# Patient Record
Sex: Female | Born: 1935 | Race: White | Hispanic: No | Smoking: Former smoker
Health system: Southern US, Community
[De-identification: ages and names within clinical notes are randomized; demographics above are authoritative.]

## PROBLEM LIST (undated history)

## (undated) DIAGNOSIS — R51 Headache: Secondary | ICD-10-CM

## (undated) DIAGNOSIS — I495 Sick sinus syndrome: Secondary | ICD-10-CM

## (undated) DIAGNOSIS — Z9989 Dependence on other enabling machines and devices: Secondary | ICD-10-CM

## (undated) DIAGNOSIS — I739 Peripheral vascular disease, unspecified: Secondary | ICD-10-CM

## (undated) DIAGNOSIS — E785 Hyperlipidemia, unspecified: Secondary | ICD-10-CM

## (undated) DIAGNOSIS — I4891 Unspecified atrial fibrillation: Secondary | ICD-10-CM

## (undated) DIAGNOSIS — R413 Other amnesia: Secondary | ICD-10-CM

## (undated) DIAGNOSIS — Z950 Presence of cardiac pacemaker: Secondary | ICD-10-CM

## (undated) DIAGNOSIS — I10 Essential (primary) hypertension: Secondary | ICD-10-CM

## (undated) DIAGNOSIS — I2510 Atherosclerotic heart disease of native coronary artery without angina pectoris: Secondary | ICD-10-CM

## (undated) DIAGNOSIS — G4733 Obstructive sleep apnea (adult) (pediatric): Secondary | ICD-10-CM

## (undated) HISTORY — PX: LUMBAR LAMINECTOMY: SHX95

## (undated) HISTORY — DX: Atherosclerotic heart disease of native coronary artery without angina pectoris: I25.10

## (undated) HISTORY — PX: TONSILLECTOMY: SUR1361

## (undated) HISTORY — DX: Hyperlipidemia, unspecified: E78.5

## (undated) HISTORY — DX: Dependence on other enabling machines and devices: Z99.89

## (undated) HISTORY — DX: Sick sinus syndrome: I49.5

## (undated) HISTORY — PX: ORIF HIP FRACTURE: SHX2125

## (undated) HISTORY — DX: Headache: R51

## (undated) HISTORY — PX: TOTAL HIP ARTHROPLASTY: SHX124

## (undated) HISTORY — DX: Essential (primary) hypertension: I10

## (undated) HISTORY — PX: LUMBAR FUSION: SHX111

## (undated) HISTORY — DX: Obstructive sleep apnea (adult) (pediatric): G47.33

## (undated) HISTORY — PX: CORONARY ANGIOPLASTY WITH STENT PLACEMENT: SHX49

---

## 2001-05-06 ENCOUNTER — Encounter: Payer: Self-pay | Admitting: Internal Medicine

## 2002-10-11 ENCOUNTER — Other Ambulatory Visit: Admission: RE | Admit: 2002-10-11 | Discharge: 2002-10-11 | Payer: Self-pay | Admitting: Obstetrics and Gynecology

## 2002-10-22 ENCOUNTER — Encounter: Payer: Self-pay | Admitting: Surgery

## 2002-10-25 ENCOUNTER — Encounter: Payer: Self-pay | Admitting: Surgery

## 2002-10-25 ENCOUNTER — Ambulatory Visit (HOSPITAL_COMMUNITY): Admission: RE | Admit: 2002-10-25 | Discharge: 2002-10-26 | Payer: Self-pay | Admitting: Surgery

## 2002-10-26 ENCOUNTER — Encounter: Payer: Self-pay | Admitting: Surgery

## 2003-01-27 ENCOUNTER — Encounter: Payer: Self-pay | Admitting: Emergency Medicine

## 2003-01-27 ENCOUNTER — Inpatient Hospital Stay (HOSPITAL_COMMUNITY): Admission: EM | Admit: 2003-01-27 | Discharge: 2003-01-28 | Payer: Self-pay | Admitting: Emergency Medicine

## 2003-02-16 ENCOUNTER — Encounter: Payer: Self-pay | Admitting: Family Medicine

## 2003-02-16 ENCOUNTER — Encounter: Admission: RE | Admit: 2003-02-16 | Discharge: 2003-02-16 | Payer: Self-pay | Admitting: Family Medicine

## 2003-03-01 ENCOUNTER — Ambulatory Visit (HOSPITAL_COMMUNITY): Admission: RE | Admit: 2003-03-01 | Discharge: 2003-03-01 | Payer: Self-pay | Admitting: Orthopedic Surgery

## 2003-03-01 ENCOUNTER — Encounter: Payer: Self-pay | Admitting: Orthopedic Surgery

## 2003-12-20 ENCOUNTER — Ambulatory Visit: Admission: RE | Admit: 2003-12-20 | Discharge: 2003-12-20 | Payer: Self-pay | Admitting: Internal Medicine

## 2004-02-27 ENCOUNTER — Encounter: Admission: RE | Admit: 2004-02-27 | Discharge: 2004-02-27 | Payer: Self-pay | Admitting: Obstetrics and Gynecology

## 2004-04-15 ENCOUNTER — Encounter: Payer: Self-pay | Admitting: Internal Medicine

## 2005-01-15 ENCOUNTER — Encounter: Admission: RE | Admit: 2005-01-15 | Discharge: 2005-04-15 | Payer: Self-pay | Admitting: Orthopaedic Surgery

## 2005-01-30 ENCOUNTER — Encounter: Admission: RE | Admit: 2005-01-30 | Discharge: 2005-01-30 | Payer: Self-pay | Admitting: Orthopaedic Surgery

## 2005-02-06 ENCOUNTER — Ambulatory Visit (HOSPITAL_COMMUNITY): Admission: RE | Admit: 2005-02-06 | Discharge: 2005-02-06 | Payer: Self-pay | Admitting: Neurosurgery

## 2005-02-19 ENCOUNTER — Inpatient Hospital Stay (HOSPITAL_COMMUNITY): Admission: RE | Admit: 2005-02-19 | Discharge: 2005-02-20 | Payer: Self-pay | Admitting: Neurosurgery

## 2005-05-21 ENCOUNTER — Encounter: Admission: RE | Admit: 2005-05-21 | Discharge: 2005-05-21 | Payer: Self-pay | Admitting: Family Medicine

## 2005-05-30 ENCOUNTER — Encounter: Admission: RE | Admit: 2005-05-30 | Discharge: 2005-05-30 | Payer: Self-pay | Admitting: Orthopaedic Surgery

## 2005-06-10 ENCOUNTER — Encounter: Admission: RE | Admit: 2005-06-10 | Discharge: 2005-06-10 | Payer: Self-pay | Admitting: Orthopaedic Surgery

## 2005-06-11 ENCOUNTER — Encounter: Admission: RE | Admit: 2005-06-11 | Discharge: 2005-06-11 | Payer: Self-pay | Admitting: Orthopaedic Surgery

## 2005-07-09 ENCOUNTER — Ambulatory Visit (HOSPITAL_COMMUNITY): Admission: RE | Admit: 2005-07-09 | Discharge: 2005-07-09 | Payer: Self-pay | Admitting: Neurosurgery

## 2005-07-18 ENCOUNTER — Inpatient Hospital Stay (HOSPITAL_COMMUNITY): Admission: RE | Admit: 2005-07-18 | Discharge: 2005-07-21 | Payer: Self-pay | Admitting: Neurosurgery

## 2006-05-22 ENCOUNTER — Encounter: Admission: RE | Admit: 2006-05-22 | Discharge: 2006-05-22 | Payer: Self-pay | Admitting: Obstetrics and Gynecology

## 2006-06-11 ENCOUNTER — Ambulatory Visit (HOSPITAL_COMMUNITY): Admission: RE | Admit: 2006-06-11 | Discharge: 2006-06-11 | Payer: Self-pay | Admitting: Neurosurgery

## 2006-06-11 ENCOUNTER — Encounter: Payer: Self-pay | Admitting: Vascular Surgery

## 2007-06-09 ENCOUNTER — Encounter: Admission: RE | Admit: 2007-06-09 | Discharge: 2007-06-09 | Payer: Self-pay | Admitting: Obstetrics and Gynecology

## 2007-09-01 ENCOUNTER — Emergency Department (HOSPITAL_COMMUNITY): Admission: EM | Admit: 2007-09-01 | Discharge: 2007-09-01 | Payer: Self-pay | Admitting: Emergency Medicine

## 2007-09-09 ENCOUNTER — Encounter: Admission: RE | Admit: 2007-09-09 | Discharge: 2007-09-09 | Payer: Self-pay | Admitting: Orthopaedic Surgery

## 2007-09-11 ENCOUNTER — Encounter: Admission: RE | Admit: 2007-09-11 | Discharge: 2007-09-11 | Payer: Self-pay | Admitting: Orthopaedic Surgery

## 2007-10-20 ENCOUNTER — Encounter: Admission: RE | Admit: 2007-10-20 | Discharge: 2007-10-20 | Payer: Self-pay | Admitting: Interventional Radiology

## 2007-11-23 ENCOUNTER — Encounter: Admission: RE | Admit: 2007-11-23 | Discharge: 2007-11-23 | Payer: Self-pay | Admitting: Cardiology

## 2007-12-09 ENCOUNTER — Ambulatory Visit (HOSPITAL_COMMUNITY): Admission: RE | Admit: 2007-12-09 | Discharge: 2007-12-10 | Payer: Self-pay | Admitting: Cardiology

## 2007-12-09 HISTORY — PX: CORONARY ANGIOPLASTY WITH STENT PLACEMENT: SHX49

## 2008-03-08 ENCOUNTER — Ambulatory Visit (HOSPITAL_COMMUNITY): Admission: RE | Admit: 2008-03-08 | Discharge: 2008-03-08 | Payer: Self-pay | Admitting: Cardiovascular Disease

## 2008-03-20 ENCOUNTER — Inpatient Hospital Stay (HOSPITAL_COMMUNITY): Admission: EM | Admit: 2008-03-20 | Discharge: 2008-03-23 | Payer: Self-pay | Admitting: Emergency Medicine

## 2008-03-22 HISTORY — PX: CARDIAC CATHETERIZATION: SHX172

## 2008-04-28 ENCOUNTER — Encounter: Admission: RE | Admit: 2008-04-28 | Discharge: 2008-04-28 | Payer: Self-pay | Admitting: Neurosurgery

## 2008-06-06 ENCOUNTER — Ambulatory Visit (HOSPITAL_COMMUNITY): Admission: RE | Admit: 2008-06-06 | Discharge: 2008-06-06 | Payer: Self-pay | Admitting: *Deleted

## 2008-06-14 ENCOUNTER — Encounter: Admission: RE | Admit: 2008-06-14 | Discharge: 2008-06-14 | Payer: Self-pay | Admitting: *Deleted

## 2008-07-07 ENCOUNTER — Encounter: Admission: RE | Admit: 2008-07-07 | Discharge: 2008-07-07 | Payer: Self-pay | Admitting: Cardiovascular Disease

## 2008-07-12 ENCOUNTER — Inpatient Hospital Stay (HOSPITAL_COMMUNITY): Admission: RE | Admit: 2008-07-12 | Discharge: 2008-07-14 | Payer: Self-pay | Admitting: Cardiovascular Disease

## 2009-03-02 ENCOUNTER — Ambulatory Visit (HOSPITAL_COMMUNITY): Admission: RE | Admit: 2009-03-02 | Discharge: 2009-03-02 | Payer: Self-pay | Admitting: Neurosurgery

## 2009-03-22 ENCOUNTER — Inpatient Hospital Stay (HOSPITAL_COMMUNITY): Admission: AD | Admit: 2009-03-22 | Discharge: 2009-03-25 | Payer: Self-pay | Admitting: Cardiology

## 2009-10-13 IMAGING — CT CT ANGIO CHEST
2 of 6 series · 19 of 36 positions shown · IV contrast (APPLIED)
Comparison: None

CLINICAL DATA: Shortness of breath, syncope.

CT ANGIOGRAPHY CHEST WITH CONTRAST
TECHNIQUE: Multidetector CT imaging of the chest was performed
using the standard protocol during bolus administration of
intravenous contrast. Multiplanar CT image reconstructions
including MIPs were obtained to evaluate the vascular anatomy.
Contrast: 100 ml Fmnipaque-TNN

[Series 8: pulm embolism 1.0 b25f thins · axial · 0.62mm/px · z∈[-199,+26]mm · 18 of 251 slices shown]
[im 13/251  lung]
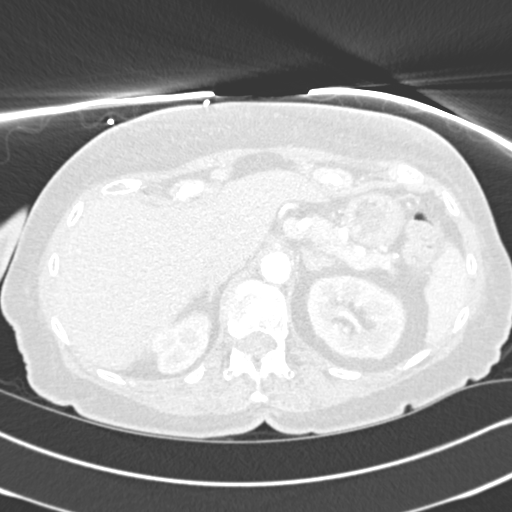
[im 26/251  mediastinal]
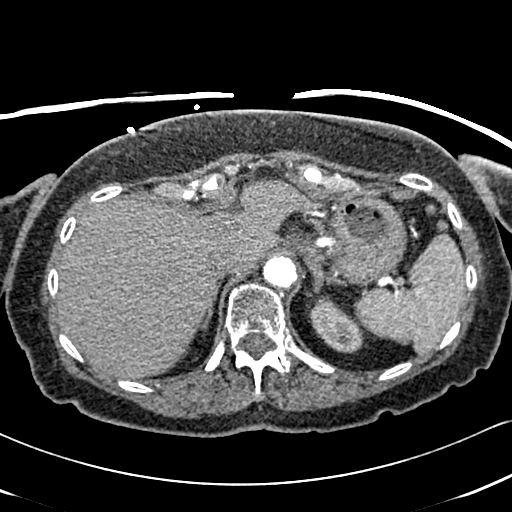
[im 38/251  lung]
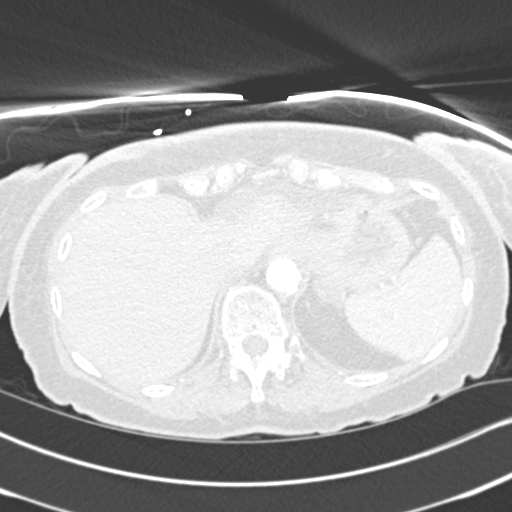
[im 51/251  mediastinal]
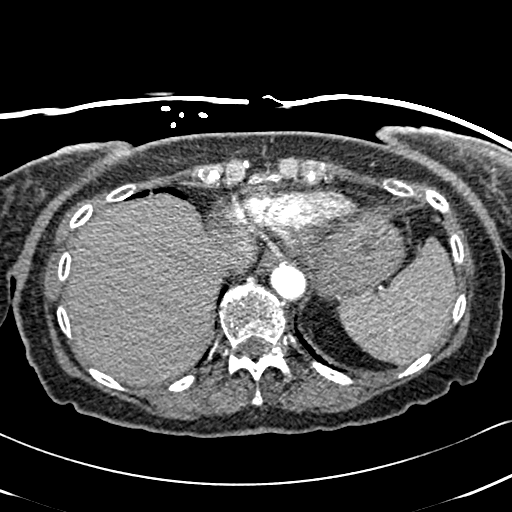
[im 63/251  lung]
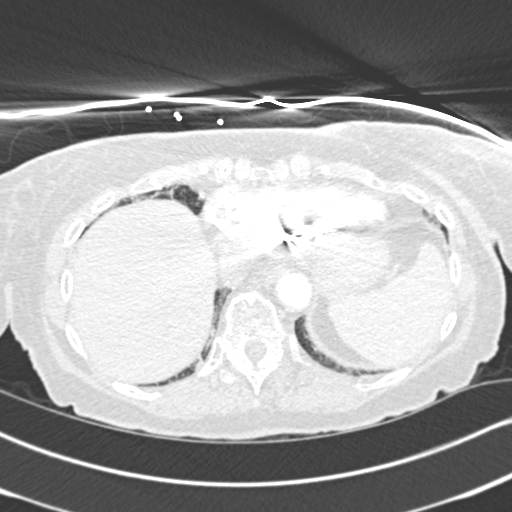
[im 76/251  mediastinal]
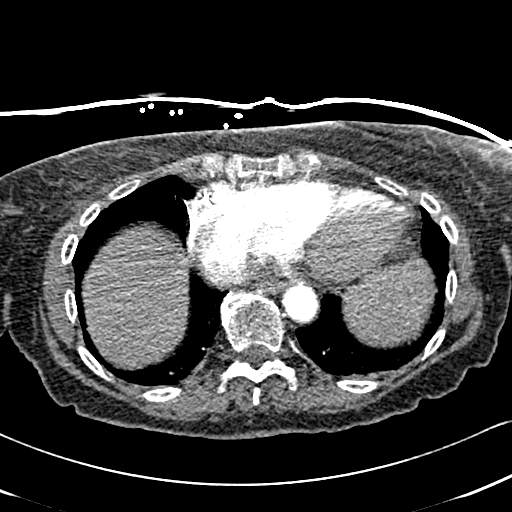
[im 88/251  lung]
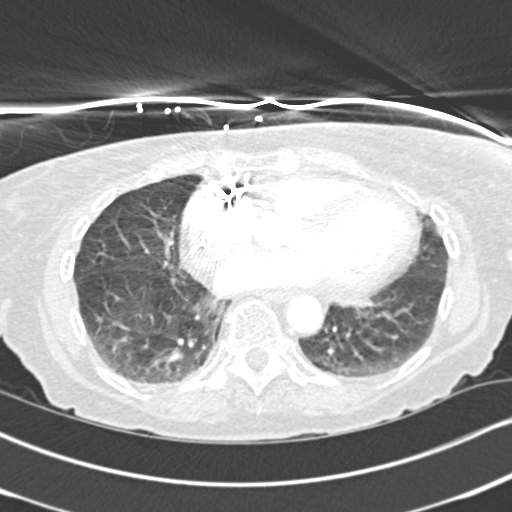
[im 101/251  mediastinal]
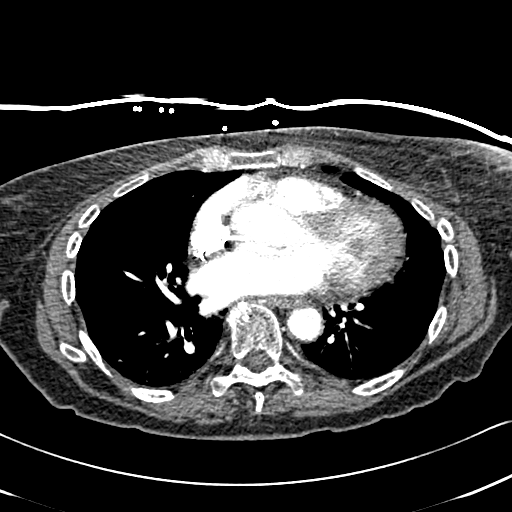
[im 113/251  lung]
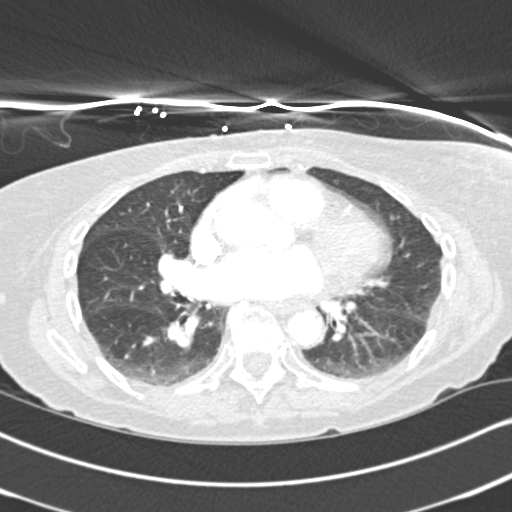
[im 138/251  mediastinal]
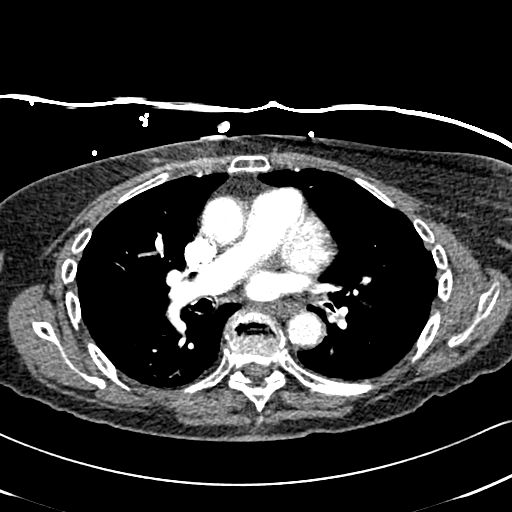
[im 151/251  lung]
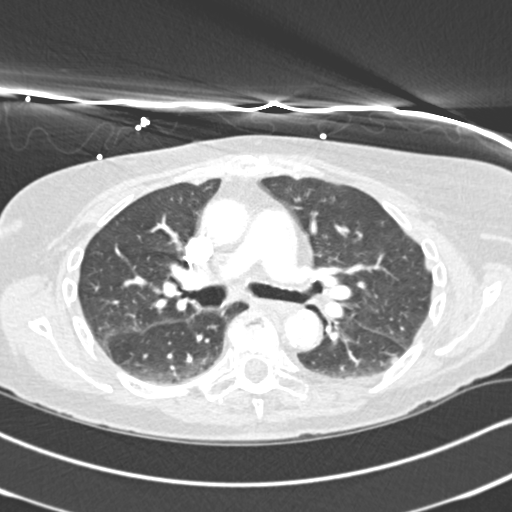
[im 163/251  mediastinal]
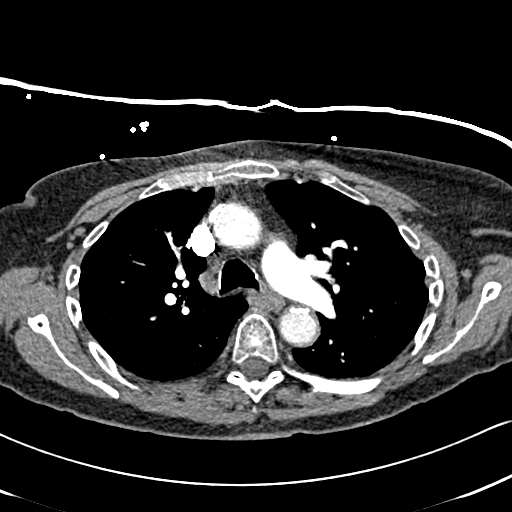
[im 176/251  lung]
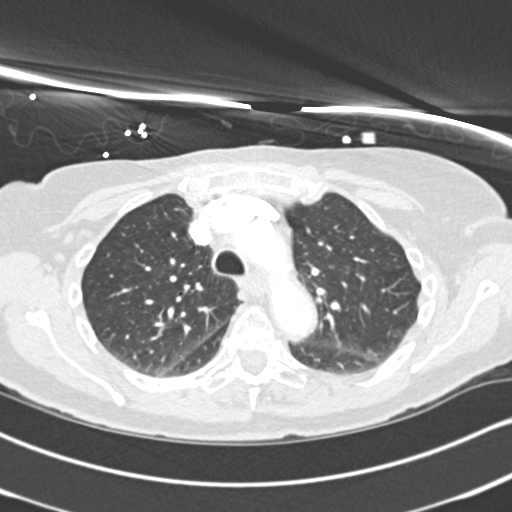
[im 188/251  mediastinal]
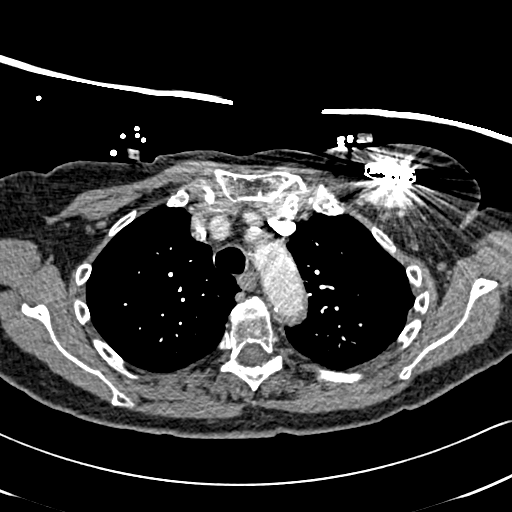
[im 201/251  lung]
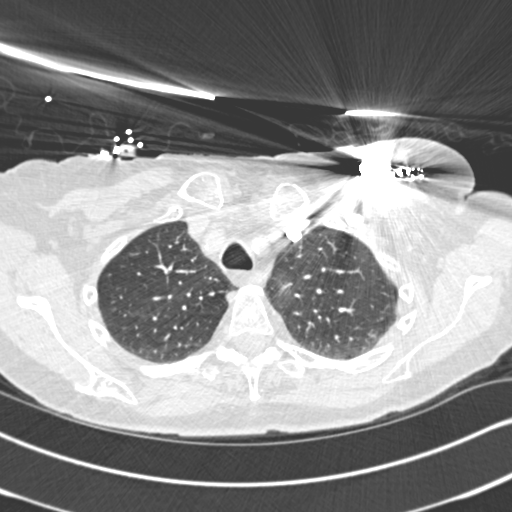
[im 213/251  mediastinal]
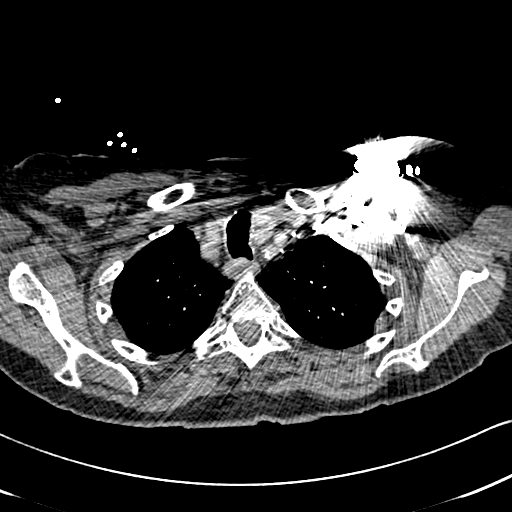
[im 226/251  lung]
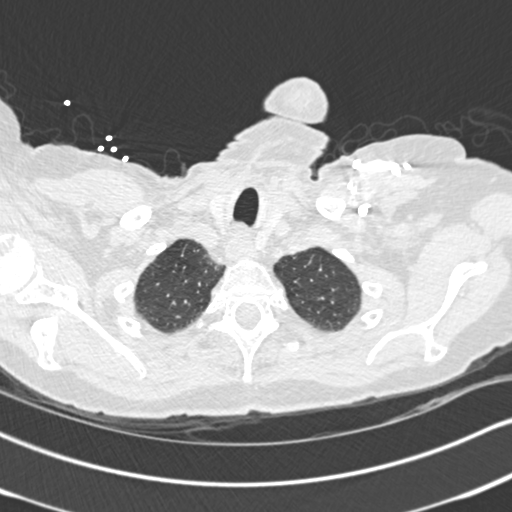
[im 238/251  mediastinal]
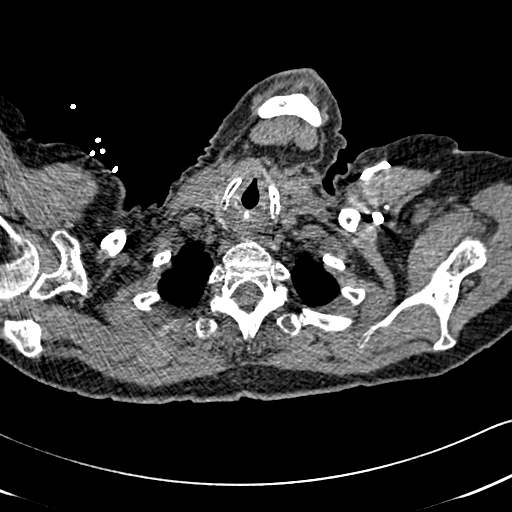

[Series 602: coronal · coronal · 0.62mm/px · 1 of 50 slices shown]
[im 25/50  mediastinal]
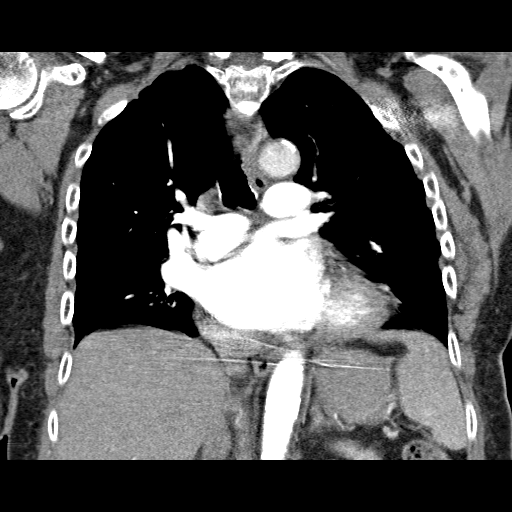

[19 of 36 positions shown; findings below may reference images not displayed]

FINDINGS: There is cardiomegaly.  Coronary artery calcifications
present.  Aorta is normal caliber.  Mild atherosclerotic disease in
the descending thoracic aorta.

No filling defects in the pulmonary arteries to suggest pulmonary
emboli. No mediastinal, hilar, or axillary adenopathy.  Visualized
thyroid and chest wall soft tissues unremarkable. Imaging into the
upper abdomen shows no acute findings.

Pacer is in place.  Review of lung windows demonstrates dependent
atelectasis.  Otherwise no focal opacities.  No effusions.

Review of the MIP images confirms the above findings.
IMPRESSION: No evidence of pulmonary embolus.

Cardiomegaly, coronary artery disease.

## 2009-11-24 ENCOUNTER — Inpatient Hospital Stay (HOSPITAL_COMMUNITY): Admission: EM | Admit: 2009-11-24 | Discharge: 2009-11-29 | Payer: Self-pay | Admitting: Emergency Medicine

## 2010-06-05 ENCOUNTER — Encounter: Admission: RE | Admit: 2010-06-05 | Discharge: 2010-06-05 | Payer: Self-pay | Admitting: Orthopedic Surgery

## 2010-07-26 ENCOUNTER — Inpatient Hospital Stay (HOSPITAL_COMMUNITY): Admission: RE | Admit: 2010-07-26 | Discharge: 2010-07-30 | Payer: Self-pay | Admitting: Orthopedic Surgery

## 2010-10-31 ENCOUNTER — Ambulatory Visit: Payer: Self-pay | Admitting: Internal Medicine

## 2010-10-31 ENCOUNTER — Encounter: Payer: Self-pay | Admitting: Internal Medicine

## 2010-10-31 DIAGNOSIS — R51 Headache: Secondary | ICD-10-CM

## 2010-10-31 DIAGNOSIS — I10 Essential (primary) hypertension: Secondary | ICD-10-CM | POA: Insufficient documentation

## 2010-10-31 DIAGNOSIS — E785 Hyperlipidemia, unspecified: Secondary | ICD-10-CM | POA: Insufficient documentation

## 2010-10-31 DIAGNOSIS — M1990 Unspecified osteoarthritis, unspecified site: Secondary | ICD-10-CM | POA: Insufficient documentation

## 2010-10-31 DIAGNOSIS — R519 Headache, unspecified: Secondary | ICD-10-CM | POA: Insufficient documentation

## 2010-10-31 DIAGNOSIS — M810 Age-related osteoporosis without current pathological fracture: Secondary | ICD-10-CM | POA: Insufficient documentation

## 2010-10-31 DIAGNOSIS — I2510 Atherosclerotic heart disease of native coronary artery without angina pectoris: Secondary | ICD-10-CM | POA: Insufficient documentation

## 2010-10-31 DIAGNOSIS — E559 Vitamin D deficiency, unspecified: Secondary | ICD-10-CM | POA: Insufficient documentation

## 2010-10-31 DIAGNOSIS — R279 Unspecified lack of coordination: Secondary | ICD-10-CM

## 2010-10-31 DIAGNOSIS — R413 Other amnesia: Secondary | ICD-10-CM | POA: Insufficient documentation

## 2010-10-31 LAB — CONVERTED CEMR LAB
ALT: 15 units/L (ref 0–35)
AST: 27 units/L (ref 0–37)
Albumin: 3.9 g/dL (ref 3.5–5.2)
Alkaline Phosphatase: 59 units/L (ref 39–117)
Basophils Absolute: 0 10*3/uL (ref 0.0–0.1)
Calcium: 9.3 mg/dL (ref 8.4–10.5)
Eosinophils Relative: 1.2 % (ref 0.0–5.0)
GFR calc non Af Amer: 81.6 mL/min (ref >60.00)
Glucose, Bld: 101 mg/dL — ABNORMAL HIGH (ref 70–99)
HCT: 37.2 % (ref 36.0–46.0)
Hemoglobin: 13.3 g/dL (ref 12.0–15.0)
Lymphocytes Relative: 15.7 % (ref 12.0–46.0)
Lymphs Abs: 1.2 10*3/uL (ref 0.7–4.0)
Monocytes Relative: 7.3 % (ref 3.0–12.0)
Neutro Abs: 6 10*3/uL (ref 1.4–7.7)
Potassium: 4.5 meq/L (ref 3.5–5.1)
RDW: 13.9 % (ref 11.5–14.6)
Sed Rate: 22 mm/hr (ref 0–22)
Sodium: 137 meq/L (ref 135–145)
TSH: 1.7 microintl units/mL (ref 0.35–5.50)
Total Bilirubin: 0.5 mg/dL (ref 0.3–1.2)
WBC: 7.9 10*3/uL (ref 4.5–10.5)

## 2010-11-01 ENCOUNTER — Telehealth: Payer: Self-pay | Admitting: Internal Medicine

## 2010-11-02 ENCOUNTER — Encounter: Payer: Self-pay | Admitting: Internal Medicine

## 2010-11-05 ENCOUNTER — Ambulatory Visit: Payer: Self-pay | Admitting: Cardiology

## 2010-11-15 ENCOUNTER — Encounter
Admission: RE | Admit: 2010-11-15 | Discharge: 2010-11-15 | Payer: Self-pay | Source: Home / Self Care | Attending: Internal Medicine | Admitting: Internal Medicine

## 2010-12-03 ENCOUNTER — Encounter
Admission: RE | Admit: 2010-12-03 | Discharge: 2010-12-25 | Payer: Self-pay | Source: Home / Self Care | Attending: Internal Medicine | Admitting: Internal Medicine

## 2010-12-16 ENCOUNTER — Encounter: Payer: Self-pay | Admitting: Interventional Radiology

## 2010-12-25 ENCOUNTER — Encounter: Payer: Self-pay | Admitting: Internal Medicine

## 2010-12-25 NOTE — Progress Notes (Signed)
Summary: CT  Phone Note Call from Patient Call back at Home Phone [phone]   Caller: Patient Call For: Etta Grandchild MD Reason for Call: Talk to Nurse Summary of Call: Requesting call back from nurse Initial call taken by: Orlan Leavens RMA,  November 01, 2010 11:14 AM  Follow-up for Phone Call        left mess to call office back...................Marland KitchenLamar Sprinkles, CMA  November 01, 2010 3:37 PM   Patient is requesting referral for CT prior to office visit w/spine specialist on 12/21. Follow-up by: Lamar Sprinkles, CMA,  November 02, 2010 11:25 AM  Additional Follow-up for Phone Call Additional follow up Details #1::        please check orders before sending a note, this CT has been ordered and scheduled Additional Follow-up by: Etta Grandchild MD,  November 02, 2010 11:29 AM    Additional Follow-up for Phone Call Additional follow up Details #2::    Original message was pt needed ct of her back. Spoke w/pt, she needs CT brain.  [facility] notes state pt is aware but I just spoke w/pt and she does not remember anyone talking her about CT. Transferred to [facility] for details. Follow-up by: Lamar Sprinkles, CMA,  November 02, 2010 11:52 AM

## 2010-12-25 NOTE — Assessment & Plan Note (Signed)
Summary: new / medicare / # cd   RS'D PER PT/NWS   Vital Signs:  Patient profile:   77 year old female Menstrual status:  postmenopausal Height:      62 inches Weight:      130.25 pounds BMI:     23.91 O2 Sat:      95 % on Room air Temp:     98.1 degrees F oral Pulse rate:   62 / minute Pulse rhythm:   regular Resp:     16 per minute BP sitting:   130 / 64  (left arm) Cuff size:   regular  Vitals Entered By: Rock Nephew CMA (October 31, 2010 1:30 PM)  O2 Flow:  Room air CC: New to establish, Hypertension Management, Preventive Care Is Patient Diabetic? No Pain Assessment Patient in pain? no          Menstrual Status postmenopausal   Primary Care [facility] Gul:  Etta Grandchild MD  CC:  New to establish, Hypertension Management, and Preventive Care.  History of Present Illness: New to me she comes in today and says that she needs a new PCP as her previous PCP, Dr. Su Hilt, is retiring soon. She has been declining over the last year  with memory loss and ataxia - she attributes this to having 2 hip surgeries and not recovering well from general anesthesia. She has trouble remembering recent events and daily activites. She knows that today is "[employer] Day".  Hypertension History:      She denies headache, chest pain, palpitations, dyspnea with exertion, orthopnea, PND, peripheral edema, visual symptoms, neurologic problems, syncope, and side effects from treatment.  She notes no problems with any antihypertensive medication side effects.        Positive major cardiovascular risk factors include female age 27 years old or older, hyperlipidemia, and hypertension.  Negative major cardiovascular risk factors include no history of diabetes and non-tobacco-user status.        Positive history for target organ damage include ASHD (either angina/prior MI/prior CABG).  Further assessment for target organ damage reveals no history of cardiac end-organ damage (CHF/LVH), stroke/TIA,  peripheral vascular disease, renal insufficiency, or hypertensive retinopathy.     Preventive Screening-Counseling & Management  Alcohol-Tobacco     Alcohol drinks/day: 0     Alcohol Counseling: not indicated; patient does not drink     Smoking Status: never     Tobacco Counseling: not indicated; no tobacco use  Caffeine-Diet-Exercise     Does Patient Exercise: no  Hep-HIV-STD-Contraception     Hepatitis Risk: no risk noted     HIV Risk: no risk noted     STD Risk: no risk noted     SBE monthly: yes     SBE Education/Counseling: to perform regular SBE      Sexual History:  not active.        Drug Use:  no.        Blood Transfusions:  no.    Current Medications (verified): 1)  Trazodone Hcl 50 Mg Tabs (Trazodone Hcl) 2)  Plavix 75 Mg Tabs (Clopidogrel Bisulfate) 3)  Vitamin D3 50000 Unit Caps (Cholecalciferol) 4)  Detrol La 2 Mg Xr24h-Cap (Tolterodine Tartrate) 5)  Losartan Potassium 100 Mg Tabs (Losartan Potassium) 6)  Celebrex 200 Mg Caps (Celecoxib) 7)  Amlodipine Besylate 5 Mg Tabs (Amlodipine Besylate) 8)  Aspirin 81mg   Allergies (verified): No Known Drug Allergies  Past History:  Past Medical History: Coronary artery disease Headache Hyperlipidemia Hypertension  Osteoarthritis Osteoporosis  Past Surgical History: Hip fracture ORIF Total hip replacement Lumbar laminectomy Lumbar fusion Permanent pacemaker PTCA/stent Tonsillectomy  Family History: Reviewed history and no changes required. Family History of Arthritis Family History Hypertension  Social History: Reviewed history and no changes required. Retired Conservation officer, [employer] Never Smoked Alcohol use-no Drug use-no Regular exercise-no Smoking Status:  never Hepatitis Risk:  no risk noted HIV Risk:  no risk noted STD Risk:  no risk noted Sexual History:  not active Blood Transfusions:  no Drug Use:  no Does Patient Exercise:  no  Review of Systems       The patient complains of weight  gain.  The patient denies anorexia, fever, weight loss, chest pain, syncope, dyspnea on exertion, peripheral edema, prolonged cough, headaches, hemoptysis, abdominal pain, incontinence, muscle weakness, suspicious skin lesions, depression, and breast masses.   Neuro:  Complains of difficulty with concentration, disturbances in coordination, memory loss, and poor balance; denies brief paralysis, falling down, headaches, inability to speak, numbness, seizures, sensation of room spinning, tingling, tremors, visual disturbances, and weakness.  Physical Exam  General:  alert, well-developed, well-nourished, well-hydrated, appropriate dress, normal appearance, healthy-appearing, and overweight-appearing.   Head:  normocephalic, atraumatic, no abnormalities observed, and no abnormalities palpated.   Eyes:  right pupil is very large and irregular. left pupil is 2 mm and does not constrict. Mouth:  Oral mucosa and oropharynx without lesions or exudates.  Teeth in good repair. Neck:  supple, full ROM, no masses, no thyromegaly, no thyroid nodules or tenderness, no JVD, normal carotid upstroke, no carotid bruits, no cervical lymphadenopathy, and no neck tenderness.   Lungs:  normal respiratory effort, no intercostal retractions, no accessory muscle use, normal breath sounds, no dullness, no fremitus, no crackles, and no wheezes.   Heart:  normal rate, regular rhythm, no murmur, no gallop, no rub, and no JVD.   Msk:  normal ROM, no joint tenderness, no joint swelling, no joint warmth, no redness over joints, no joint deformities, no joint instability, no crepitation, and no muscle atrophy.   Pulses:  R and L carotid,radial,femoral,dorsalis pedis and posterior tibial pulses are full and equal bilaterally Extremities:  No clubbing, cyanosis, edema, or deformity noted with normal full range of motion of all joints.   Neurologic:  alert & oriented X3, cranial nerves II-XII intact, strength normal in all extremities,  sensation intact to light touch, sensation intact to pinprick, DTRs symmetrical and normal, and toes up bilaterally on Babinski.  Her steps are short but her balance is good and Rhomberg test ir normal. Psych:  Oriented X3, normally interactive, good eye contact, not anxious appearing, not depressed appearing, easily distracted, poor concentration, and memory impairment.     Impression & Recommendations:  Problem # 1:  HYPERTENSION (ICD-401.9) Assessment Improved  Her updated medication list for this problem includes:    Losartan Potassium 100 Mg Tabs (Losartan potassium)    Amlodipine Besylate 5 Mg Tabs (Amlodipine besylate)  Orders: Venipuncture (47829) TLB-BMP (Basic Metabolic Panel-BMET) (80048-METABOL) TLB-CBC Platelet - w/Differential (85025-CBCD) TLB-Hepatic/Liver Function Pnl (80076-HEPATIC) TLB-TSH (Thyroid Stimulating Hormone) (84443-TSH) TLB-B12 + Folate Pnl (56213_08657-Q46/NGE) TLB-Sedimentation Rate (ESR) (85652-ESR) T-RPR (Syphilis) [phone]) T-Vitamin D (25-Hydroxy) [phone])  BP today: 130/64  10 Yr Risk Heart Disease: N/A  Problem # 2:  MEMORY LOSS (ICD-780.93) Assessment: New  Orders: Venipuncture (44034) TLB-BMP (Basic Metabolic Panel-BMET) (80048-METABOL) TLB-CBC Platelet - w/Differential (85025-CBCD) TLB-Hepatic/Liver Function Pnl (80076-HEPATIC) TLB-TSH (Thyroid Stimulating Hormone) (84443-TSH) TLB-B12 + Folate Pnl (74259_56387-F64/PPI) TLB-Sedimentation Rate (ESR) (85652-ESR) T-RPR (Syphilis) (95188-41660)  T-Vitamin D (25-Hydroxy) [phone]) Radiology Referral (Radiology)  Problem # 3:  ATAXIA (ICD-781.3) Assessment: New will check for CNS events and metabolic causes Orders: Venipuncture (09811) TLB-BMP (Basic Metabolic Panel-BMET) (80048-METABOL) TLB-CBC Platelet - w/Differential (85025-CBCD) TLB-Hepatic/Liver Function Pnl (80076-HEPATIC) TLB-TSH (Thyroid Stimulating Hormone) (84443-TSH) TLB-B12 + Folate Pnl  ([id number]-Z30/QMV) TLB-Sedimentation Rate (ESR) (85652-ESR) T-RPR (Syphilis) [phone]) T-Vitamin D (25-Hydroxy) [phone]) Radiology Referral (Radiology) Physical Therapy Referral (PT)  Problem # 4:  VITAMIN D DEFICIENCY (ICD-268.9) Assessment: Unchanged  Orders: Venipuncture (27253) TLB-BMP (Basic Metabolic Panel-BMET) (80048-METABOL) TLB-CBC Platelet - w/Differential (85025-CBCD) TLB-Hepatic/Liver Function Pnl (80076-HEPATIC) TLB-TSH (Thyroid Stimulating Hormone) (84443-TSH) TLB-B12 + Folate Pnl (66440_34742-V95/GLO) TLB-Sedimentation Rate (ESR) (85652-ESR) T-RPR (Syphilis) [phone]) T-Vitamin D (25-Hydroxy) (88416-60630)  Complete Medication List: 1)  Trazodone Hcl 50 Mg Tabs (Trazodone hcl) 2)  Plavix 75 Mg Tabs (Clopidogrel bisulfate) 3)  Vitamin D3 50000 Unit Caps (Cholecalciferol) 4)  Detrol La 2 Mg Xr24h-cap (Tolterodine tartrate) 5)  Losartan Potassium 100 Mg Tabs (Losartan potassium) 6)  Celebrex 200 Mg Caps (Celecoxib) 7)  Amlodipine Besylate 5 Mg Tabs (Amlodipine besylate) 8)  Aspirin 81mg    Hypertension Assessment/Plan:      The patient's hypertensive risk group is category C: Target organ damage and/or diabetes.  Today's blood pressure is 130/64.  Her blood pressure goal is < 140/90.  Colorectal Screening:  Current Recommendations:    Colonoscopy recommended: patient refused  PAP Screening:    Hx Cervical Dysplasia in last 5 yrs? No    3 normal PAP smears in last 5 yrs? Yes    Reviewed PAP smear recommendations:  patient refuses understanding risks of delayed diagnosis  Mammogram Screening:    Reviewed Mammogram recommendations:  mammogram ordered  Osteoporosis Risk Assessment:  Risk Factors for Fracture or Low Bone Density:   Race (White or Asian):     yes   Smoking status:       never  Immunization & Chemoprophylaxis:    Influenza vaccine: given  (08/25/2010)    Pneumovax: given  (08/25/2010)  Patient Instructions: 1)  Please  schedule a follow-up appointment in 1 month. 2)  It is important that you exercise regularly at least 20 minutes 5 times a week. If you develop chest pain, have severe difficulty breathing, or feel very tired , stop exercising immediately and seek medical attention. 3)  You need to lose weight. Consider a lower calorie diet and regular exercise.  4)  Schedule your mammogram.   Orders Added: 1)  Venipuncture [36415] 2)  TLB-BMP (Basic Metabolic Panel-BMET) [80048-METABOL] 3)  TLB-CBC Platelet - w/Differential [85025-CBCD] 4)  TLB-Hepatic/Liver Function Pnl [80076-HEPATIC] 5)  TLB-TSH (Thyroid Stimulating Hormone) [84443-TSH] 6)  TLB-B12 + Folate Pnl [82746_82607-B12/FOL] 7)  TLB-Sedimentation Rate (ESR) [85652-ESR] 8)  T-RPR (Syphilis) [16010-93235] 9)  T-Vitamin D (25-Hydroxy) [57322-02542] 10)  Radiology Referral [Radiology] 11)  Physical Therapy Referral [PT] 12)  Radiology Referral [Radiology] 13)  New Patient Level IV [70623]    Preventive Care Screening  Last Pneumovax:    Date:  08/25/2010    Results:  given   Last Flu Shot:    Date:  08/25/2010    Results:  given

## 2010-12-26 ENCOUNTER — Ambulatory Visit: Payer: Medicare Other | Attending: Internal Medicine | Admitting: Physical Therapy

## 2010-12-26 DIAGNOSIS — IMO0001 Reserved for inherently not codable concepts without codable children: Secondary | ICD-10-CM | POA: Insufficient documentation

## 2010-12-26 DIAGNOSIS — R269 Unspecified abnormalities of gait and mobility: Secondary | ICD-10-CM | POA: Insufficient documentation

## 2010-12-27 NOTE — Miscellaneous (Signed)
Summary: Health Care POA  Health Care POA   Imported By: Lester Phillips 11/10/2010 09:26:01  _____________________________________________________________________  External Attachment:    Type:   Image     Comment:   External Document

## 2010-12-27 NOTE — Letter (Signed)
Summary: Letter from son with directives  Letter from son with directives   Imported By: Lester Redford 11/10/2010 09:30:13  _____________________________________________________________________  External Attachment:    Type:   Image     Comment:   External Document

## 2010-12-27 NOTE — Miscellaneous (Signed)
Summary: Health Care Proxy  Health Care Proxy   Imported By: Lester Wilkes-Barre 11/10/2010 09:27:20  _____________________________________________________________________  External Attachment:    Type:   Image     Comment:   External Document

## 2010-12-27 NOTE — Miscellaneous (Signed)
Summary: Desire for a Natural Death  Desire for a Natural Death   Imported By: Lester Belzoni 11/10/2010 09:28:49  _____________________________________________________________________  External Attachment:    Type:   Image     Comment:   External Document

## 2010-12-31 ENCOUNTER — Ambulatory Visit: Payer: Medicare Other | Admitting: Physical Therapy

## 2011-01-02 ENCOUNTER — Ambulatory Visit: Payer: Medicare Other | Admitting: Physical Therapy

## 2011-01-02 ENCOUNTER — Encounter: Payer: Self-pay | Admitting: Internal Medicine

## 2011-01-02 ENCOUNTER — Ambulatory Visit (INDEPENDENT_AMBULATORY_CARE_PROVIDER_SITE_OTHER): Payer: Medicare Other | Admitting: Internal Medicine

## 2011-01-02 DIAGNOSIS — I10 Essential (primary) hypertension: Secondary | ICD-10-CM

## 2011-01-02 DIAGNOSIS — R413 Other amnesia: Secondary | ICD-10-CM

## 2011-01-02 DIAGNOSIS — R279 Unspecified lack of coordination: Secondary | ICD-10-CM

## 2011-01-02 DIAGNOSIS — N318 Other neuromuscular dysfunction of bladder: Secondary | ICD-10-CM

## 2011-01-10 NOTE — Assessment & Plan Note (Signed)
Summary: 2 MO ROV/NWS   Vital Signs:  Patient profile:   77 year old female Menstrual status:  postmenopausal Height:      62 inches Weight:      130.25 pounds BMI:     23.91 O2 Sat:      98 % on Room air Temp:     97.6 degrees F oral Pulse rate:   75 / minute Pulse rhythm:   regular Resp:     16 per minute BP sitting:   124 / 62  (left arm) Cuff size:   regular  Vitals Entered By: Rock Nephew CMA (January 02, 2011 2:07 PM)  O2 Flow:  Room air CC: follow-up visit, Hypertension Management Is Patient Diabetic? No Pain Assessment Patient in pain? no       Does patient need assistance? Functional Status Self care Ambulation Normal   Primary Care Provider:  Etta Grandchild MD  CC:  follow-up visit and Hypertension Management.  History of Present Illness: She returns for f/up and tells me that she is doing well. She was at a meeting last week and says that she did some dancing. She tells me that her son took her to [facility] for some testing last week and that after 3 hours of evaluation they told her that she had some minor brain damage from her surgeries last year but that she is geting better.  She wants to try a higher dose of detrol b/c she is still urinating frequently.  She wants to try a higher dose of trazodone b/c she is still having trouble sleeping with DFA and FA's.  Hypertension History:      She denies headache, chest pain, palpitations, dyspnea with exertion, orthopnea, PND, peripheral edema, visual symptoms, neurologic problems, syncope, and side effects from treatment.  She notes no problems with any antihypertensive medication side effects.        Positive major cardiovascular risk factors include female age 66 years old or older, hyperlipidemia, and hypertension.  Negative major cardiovascular risk factors include no history of diabetes and non-tobacco-user status.        Positive history for target organ damage include ASHD (either angina/prior  MI/prior CABG).  Further assessment for target organ damage reveals no history of cardiac end-organ damage (CHF/LVH), stroke/TIA, peripheral vascular disease, renal insufficiency, or hypertensive retinopathy.     Preventive Screening-Counseling & Management  Alcohol-Tobacco     Alcohol drinks/day: 0     Alcohol Counseling: not indicated; patient does not drink     Smoking Status: never     Tobacco Counseling: not indicated; no tobacco use  Hep-HIV-STD-Contraception     Hepatitis Risk: no risk noted     HIV Risk: no risk noted     STD Risk: no risk noted     SBE monthly: yes     SBE Education/Counseling: to perform regular SBE      Sexual History:  not active.        Drug Use:  no.        Blood Transfusions:  no.    Clinical Review Panels:  Prevention   Last Mammogram:  ASSESSMENT: Negative - BI-RADS 1^MM DIGITAL SCREENING (11/15/2010)  Immunizations   Last Flu Vaccine:  given (08/25/2010)   Last Pneumovax:  given (08/25/2010)  Diabetes Management   Creatinine:  0.7 (10/31/2010)   Last Flu Vaccine:  given (08/25/2010)   Last Pneumovax:  given (08/25/2010)  CBC   WBC:  7.9 (10/31/2010)   RBC:  4.01 (10/31/2010)   Hgb:  13.3 (10/31/2010)   Hct:  37.2 (10/31/2010)   Platelets:  172.0 (10/31/2010)   MCV  92.8 (10/31/2010)   MCHC  35.7 (10/31/2010)   RDW  13.9 (10/31/2010)   PMN:  75.5 (10/31/2010)   Lymphs:  15.7 (10/31/2010)   Monos:  7.3 (10/31/2010)   Eosinophils:  1.2 (10/31/2010)   Basophil:  0.3 (10/31/2010)  Complete Metabolic Panel   Glucose:  101 (10/31/2010)   Sodium:  137 (10/31/2010)   Potassium:  4.5 (10/31/2010)   Chloride:  99 (10/31/2010)   CO2:  31 (10/31/2010)   BUN:  22 (10/31/2010)   Creatinine:  0.7 (10/31/2010)   Albumin:  3.9 (10/31/2010)   Total Protein:  6.1 (10/31/2010)   Calcium:  9.3 (10/31/2010)   Total Bili:  0.5 (10/31/2010)   Alk Phos:  59 (10/31/2010)   SGPT (ALT):  15 (10/31/2010)   SGOT (AST):  27  (10/31/2010)   Medications Prior to Update: 1)  Trazodone Hcl 50 Mg Tabs (Trazodone Hcl) 2)  Plavix 75 Mg Tabs (Clopidogrel Bisulfate) 3)  Vitamin D3 50000 Unit Caps (Cholecalciferol) 4)  Detrol La 2 Mg Xr24h-Cap (Tolterodine Tartrate) 5)  Losartan Potassium 100 Mg Tabs (Losartan Potassium) 6)  Celebrex 200 Mg Caps (Celecoxib) 7)  Amlodipine Besylate 5 Mg Tabs (Amlodipine Besylate) 8)  Aspirin 81mg   Current Medications (verified): 1)  Plavix 75 Mg Tabs (Clopidogrel Bisulfate) 2)  Vitamin D3 50000 Unit Caps (Cholecalciferol) 3)  Losartan Potassium 100 Mg Tabs (Losartan Potassium) 4)  Celebrex 200 Mg Caps (Celecoxib) 5)  Amlodipine Besylate 5 Mg Tabs (Amlodipine Besylate) 6)  Aspirin 81mg  7)  Trazodone Hcl 100 Mg Tabs (Trazodone Hcl) .... One By Mouth At Bedtime 8)  Detrol La 4 Mg Xr24h-Cap (Tolterodine Tartrate) .... One By Mouth Once Daily For Overactive Bladder  Allergies (verified): No Known Drug Allergies  Past History:  Past Medical History: Last updated: 10/31/2010 Coronary artery disease Headache Hyperlipidemia Hypertension Osteoarthritis Osteoporosis  Past Surgical History: Last updated: 10/31/2010 Hip fracture ORIF Total hip replacement Lumbar laminectomy Lumbar fusion Permanent pacemaker PTCA/stent Tonsillectomy  Family History: Last updated: 10/31/2010 Family History of Arthritis Family History Hypertension  Social History: Last updated: 10/31/2010 Retired Widow/Widower Never Smoked Alcohol use-no Drug use-no Regular exercise-no  Risk Factors: Alcohol Use: 0 (01/02/2011) Exercise: no (10/31/2010)  Risk Factors: Smoking Status: never (01/02/2011)  Family History: Reviewed history from 10/31/2010 and no changes required. Family History of Arthritis Family History Hypertension  Social History: Reviewed history from 10/31/2010 and no changes required. 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   GU:  Complains of nocturia and urinary frequency; denies discharge, dysuria, hematuria, incontinence, and urinary hesitancy. Neuro:  Complains of memory loss; denies difficulty with concentration, disturbances in coordination, falling down, headaches, inability to speak, numbness, poor balance, seizures, tingling, tremors, visual disturbances, and weakness.  Physical Exam  General:  alert, well-developed, well-nourished, well-hydrated, appropriate dress, normal appearance, healthy-appearing, and overweight-appearing.   Head:  normocephalic and atraumatic.   Mouth:  Oral mucosa and oropharynx without lesions or exudates.  Teeth in good repair. Neck:  supple, full ROM, no masses, no thyromegaly, no thyroid nodules or tenderness, no JVD, normal carotid upstroke, no carotid bruits, no cervical lymphadenopathy,  and no neck tenderness.   Lungs:  normal respiratory effort, no intercostal retractions, no accessory muscle use, normal breath sounds, no dullness, no fremitus, no crackles, and no wheezes.   Heart:  normal rate, regular rhythm, no murmur, no gallop, no rub, and no JVD.   Abdomen:  soft, non-tender, normal bowel sounds, no distention, no masses, no guarding, no rigidity, no rebound tenderness, no abdominal hernia, no inguinal hernia, no hepatomegaly, and no splenomegaly.   Msk:  normal ROM, no joint tenderness, no joint swelling, no joint warmth, no redness over joints, no joint deformities, no joint instability, no crepitation, and no muscle atrophy.   Pulses:  R and L carotid,radial,femoral,dorsalis pedis and posterior tibial pulses are full and equal  bilaterally Extremities:  No clubbing, cyanosis, edema, or deformity noted with normal full range of motion of all joints.   Neurologic:  alert & oriented X3, cranial nerves II-XII intact, strength normal in all extremities, sensation intact to light touch, sensation intact to pinprick, gait normal, and DTRs symmetrical and normal.   Skin:  turgor normal, color normal, no rashes, no suspicious lesions, no ecchymoses, no petechiae, no purpura, no ulcerations, and no edema.   Psych:  Oriented X3, normally interactive, good eye contact, not anxious appearing, not depressed appearing, easily distracted, poor concentration, and memory impairment.     Impression & Recommendations:  Problem # 1:  HYPERTENSION (ICD-401.9) Assessment Improved  Her updated medication list for this problem includes:    Losartan Potassium 100 Mg Tabs (Losartan potassium)    Amlodipine Besylate 5 Mg Tabs (Amlodipine besylate)  BP today: 124/62 Prior BP: 130/64 (10/31/2010)  Prior 10 Yr Risk Heart Disease: N/A (10/31/2010)  Labs Reviewed: K+: 4.5 (10/31/2010) Creat: : 0.7 (10/31/2010)     Problem # 2:  MEMORY LOSS (ICD-780.93) Assessment: Improved  Problem # 3:  HYPERTONICITY OF BLADDER (ICD-596.51) Assessment: Deteriorated  Problem # 4:  ATAXIA (ICD-781.3) Assessment: Improved  Complete Medication List: 1)  Plavix 75 Mg Tabs (Clopidogrel bisulfate) 2)  Vitamin D3 50000 Unit Caps (Cholecalciferol) 3)  Losartan Potassium 100 Mg Tabs (Losartan potassium) 4)  Celebrex 200 Mg Caps (Celecoxib) 5)  Amlodipine Besylate 5 Mg Tabs (Amlodipine besylate) 6)  Aspirin 81mg   7)  Trazodone Hcl 100 Mg Tabs (Trazodone hcl) .... One by mouth at bedtime 8)  Detrol La 4 Mg Xr24h-cap (Tolterodine tartrate) .... One by mouth once daily for overactive bladder  Hypertension Assessment/Plan:      The patient's hypertensive risk group is category C: Target organ damage and/or diabetes.  Today's blood pressure is 124/62.  Her  blood pressure goal is < 140/90.  Patient Instructions: 1)  Please schedule a follow-up appointment in 3 months. Prescriptions: DETROL LA 4 MG XR24H-CAP (TOLTERODINE TARTRATE) One by mouth once daily for overactive bladder  #30 x 11   Entered and Authorized by:   Etta Grandchild MD   Signed by:   Etta Grandchild MD on 01/02/2011   Method used:   Electronically to        [facility] [phone][facility] (retail)       [address]       [place], [place]  [address]       Ph: [phone] or [phone]       Fax: [phone]   RxID:   [id number] TRAZODONE HCL 100 MG TABS (TRAZODONE HCL) One by mouth at bedtime  #30 x 11   Entered and Authorized by:   Etta Grandchild MD  Signed by:   Etta Grandchild MD on 01/02/2011   Method used:   Electronically to        [facility] [phone]* (retail)       [address]       [place], [place]  [address]       Ph: [phone] or [phone]       Fax: [phone]   RxID:   [id number]    Orders Added: 1)  Est. Patient Level IV [34742]

## 2011-01-16 NOTE — Letter (Signed)
Summary: [facility]  [facility]   Imported By: Sherian Rein 01/10/2011 10:11:45  _____________________________________________________________________  External Attachment:    Type:   Image     Comment:   External Document

## 2011-01-21 ENCOUNTER — Other Ambulatory Visit: Payer: Self-pay | Admitting: Cardiology

## 2011-01-21 ENCOUNTER — Ambulatory Visit
Admission: RE | Admit: 2011-01-21 | Discharge: 2011-01-21 | Disposition: A | Discharge status: Home / Self Care | Payer: Medicare Other | Source: Ambulatory Visit | Attending: Cardiology | Admitting: Cardiology

## 2011-01-21 DIAGNOSIS — R0602 Shortness of breath: Secondary | ICD-10-CM

## 2011-01-22 NOTE — Miscellaneous (Signed)
Summary: Discharge from PT/[place]  Discharge from PT/[place]   Imported By: Sherian Rein 01/15/2011 09:22:13  _____________________________________________________________________  External Attachment:    Type:   Image     Comment:   External Document

## 2011-02-07 LAB — COMPREHENSIVE METABOLIC PANEL
ALT: 20 U/L (ref 0–35)
Alkaline Phosphatase: 37 U/L — ABNORMAL LOW (ref 39–117)
BUN: 21 mg/dL (ref 6–23)
CO2: 28 mEq/L (ref 19–32)
Calcium: 9.6 mg/dL (ref 8.4–10.5)
GFR calc non Af Amer: >60 mL/min (ref >60)
Glucose, Bld: 93 mg/dL (ref 70–99)
Sodium: 132 mEq/L — ABNORMAL LOW (ref 135–145)

## 2011-02-07 LAB — PROTIME-INR
INR: 1.05 (ref 0.00–1.49)
Prothrombin Time: 13.9 seconds (ref 11.6–15.2)

## 2011-02-07 LAB — DIFFERENTIAL
Basophils Relative: 0 % (ref 0–1)
Eosinophils Absolute: 0 10*3/uL (ref 0.0–0.7)
Lymphs Abs: 1.7 10*3/uL (ref 0.7–4.0)
Neutro Abs: 6.7 10*3/uL (ref 1.7–7.7)
Neutrophils Relative %: 73 % (ref 43–77)

## 2011-02-07 LAB — CROSSMATCH
ABO/RH(D): AB POS
Antibody Screen: NEGATIVE

## 2011-02-07 LAB — CBC
HCT: 25.4 % — ABNORMAL LOW (ref 36.0–46.0)
HCT: 30.2 % — ABNORMAL LOW (ref 36.0–46.0)
HCT: 40.9 % (ref 36.0–46.0)
Hemoglobin: 14.1 g/dL (ref 12.0–15.0)
Hemoglobin: 7.6 g/dL — ABNORMAL LOW (ref 12.0–15.0)
Hemoglobin: 8.8 g/dL — ABNORMAL LOW (ref 12.0–15.0)
MCH: 32.4 pg (ref 26.0–34.0)
MCH: 33.3 pg (ref 26.0–34.0)
MCH: 33.7 pg (ref 26.0–34.0)
MCHC: 34.6 g/dL (ref 30.0–36.0)
MCHC: 34.8 g/dL (ref 30.0–36.0)
MCHC: 34.8 g/dL (ref 30.0–36.0)
MCHC: 35.3 g/dL (ref 30.0–36.0)
MCHC: 35.7 g/dL (ref 30.0–36.0)
MCV: 92 fL (ref 78.0–100.0)
MCV: 94.2 fL (ref 78.0–100.0)
MCV: 95 fL (ref 78.0–100.0)
Platelets: 100 10*3/uL — ABNORMAL LOW (ref 150–400)
Platelets: 160 10*3/uL (ref 150–400)
Platelets: 97 10*3/uL — ABNORMAL LOW (ref 150–400)
RBC: 2.27 MIL/uL — ABNORMAL LOW (ref 3.87–5.11)
RBC: 2.66 MIL/uL — ABNORMAL LOW (ref 3.87–5.11)
RDW: 14.1 % (ref 11.5–15.5)
RDW: 14.5 % (ref 11.5–15.5)
WBC: 6 10*3/uL (ref 4.0–10.5)

## 2011-02-07 LAB — BASIC METABOLIC PANEL
BUN: 18 mg/dL (ref 6–23)
CO2: 26 mEq/L (ref 19–32)
Calcium: 7.5 mg/dL — ABNORMAL LOW (ref 8.4–10.5)
Calcium: 7.6 mg/dL — ABNORMAL LOW (ref 8.4–10.5)
Chloride: 103 mEq/L (ref 96–112)
Creatinine, Ser: 0.53 mg/dL (ref 0.4–1.2)
Creatinine, Ser: 0.71 mg/dL (ref 0.4–1.2)
GFR calc Af Amer: >60 mL/min (ref >60)
GFR calc Af Amer: >60 mL/min (ref >60)
GFR calc non Af Amer: >60 mL/min (ref >60)
Glucose, Bld: 122 mg/dL — ABNORMAL HIGH (ref 70–99)

## 2011-02-07 LAB — URINE MICROSCOPIC-ADD ON

## 2011-02-07 LAB — URINALYSIS, ROUTINE W REFLEX MICROSCOPIC
Bilirubin Urine: NEGATIVE
Glucose, UA: NEGATIVE mg/dL
Hgb urine dipstick: NEGATIVE
Ketones, ur: NEGATIVE mg/dL
Ketones, ur: NEGATIVE mg/dL
Nitrite: POSITIVE — AB
Protein, ur: NEGATIVE mg/dL
Urobilinogen, UA: 0.2 mg/dL (ref 0.0–1.0)
pH: 7.5 (ref 5.0–8.0)

## 2011-02-10 LAB — BASIC METABOLIC PANEL
BUN: 13 mg/dL (ref 6–23)
BUN: 13 mg/dL (ref 6–23)
BUN: 15 mg/dL (ref 6–23)
BUN: 8 mg/dL (ref 6–23)
CO2: 27 mEq/L (ref 19–32)
CO2: 29 mEq/L (ref 19–32)
CO2: 30 mEq/L (ref 19–32)
Calcium: 7.7 mg/dL — ABNORMAL LOW (ref 8.4–10.5)
Calcium: 7.7 mg/dL — ABNORMAL LOW (ref 8.4–10.5)
Chloride: 95 mEq/L — ABNORMAL LOW (ref 96–112)
Creatinine, Ser: 0.51 mg/dL (ref 0.4–1.2)
Creatinine, Ser: 0.52 mg/dL (ref 0.4–1.2)
Creatinine, Ser: 0.67 mg/dL (ref 0.4–1.2)
Creatinine, Ser: 0.73 mg/dL (ref 0.4–1.2)
GFR calc Af Amer: >60 mL/min (ref >60)
GFR calc non Af Amer: >60 mL/min (ref >60)
GFR calc non Af Amer: >60 mL/min (ref >60)
Glucose, Bld: 119 mg/dL — ABNORMAL HIGH (ref 70–99)
Glucose, Bld: 129 mg/dL — ABNORMAL HIGH (ref 70–99)
Glucose, Bld: 136 mg/dL — ABNORMAL HIGH (ref 70–99)
Potassium: 3.6 mEq/L (ref 3.5–5.1)
Sodium: 129 mEq/L — ABNORMAL LOW (ref 135–145)
Sodium: 131 mEq/L — ABNORMAL LOW (ref 135–145)

## 2011-02-10 LAB — CBC
HCT: 28.8 % — ABNORMAL LOW (ref 36.0–46.0)
Hemoglobin: 8.1 g/dL — ABNORMAL LOW (ref 12.0–15.0)
Hemoglobin: 9.9 g/dL — ABNORMAL LOW (ref 12.0–15.0)
MCHC: 34.1 g/dL (ref 30.0–36.0)
MCHC: 35 g/dL (ref 30.0–36.0)
MCV: 95.9 fL (ref 78.0–100.0)
Platelets: 112 10*3/uL — ABNORMAL LOW (ref 150–400)
RBC: 3.41 MIL/uL — ABNORMAL LOW (ref 3.87–5.11)
RDW: 13.2 % (ref 11.5–15.5)
RDW: 14.8 % (ref 11.5–15.5)
WBC: 5.2 10*3/uL (ref 4.0–10.5)
WBC: 5.5 10*3/uL (ref 4.0–10.5)

## 2011-02-10 LAB — URINALYSIS, MICROSCOPIC ONLY
Bilirubin Urine: NEGATIVE
Glucose, UA: NEGATIVE mg/dL
Hgb urine dipstick: NEGATIVE
Protein, ur: NEGATIVE mg/dL
Specific Gravity, Urine: 1.006 (ref 1.005–1.030)
Urobilinogen, UA: 0.2 mg/dL (ref 0.0–1.0)

## 2011-02-10 LAB — URINE CULTURE
Colony Count: NO GROWTH
Culture: NO GROWTH

## 2011-02-10 LAB — TYPE AND SCREEN

## 2011-02-10 LAB — GLUCOSE, CAPILLARY
Glucose-Capillary: 111 mg/dL — ABNORMAL HIGH (ref 70–99)
Glucose-Capillary: 114 mg/dL — ABNORMAL HIGH (ref 70–99)
Glucose-Capillary: 118 mg/dL — ABNORMAL HIGH (ref 70–99)
Glucose-Capillary: 118 mg/dL — ABNORMAL HIGH (ref 70–99)
Glucose-Capillary: 130 mg/dL — ABNORMAL HIGH (ref 70–99)
Glucose-Capillary: 131 mg/dL — ABNORMAL HIGH (ref 70–99)
Glucose-Capillary: 165 mg/dL — ABNORMAL HIGH (ref 70–99)

## 2011-02-10 LAB — COMPREHENSIVE METABOLIC PANEL
BUN: 13 mg/dL (ref 6–23)
CO2: 27 mEq/L (ref 19–32)
Chloride: 91 mEq/L — ABNORMAL LOW (ref 96–112)
Creatinine, Ser: 0.58 mg/dL (ref 0.4–1.2)
GFR calc non Af Amer: >60 mL/min (ref >60)
Glucose, Bld: 141 mg/dL — ABNORMAL HIGH (ref 70–99)
Total Bilirubin: 0.6 mg/dL (ref 0.3–1.2)

## 2011-02-10 LAB — PREPARE RBC (CROSSMATCH)

## 2011-02-10 LAB — OSMOLALITY, URINE: Osmolality, Ur: 248 mOsm/kg — ABNORMAL LOW (ref 390–1090)

## 2011-02-10 LAB — OSMOLALITY: Osmolality: 269 mOsm/kg — ABNORMAL LOW (ref 275–300)

## 2011-02-10 LAB — ABO/RH: ABO/RH(D): AB POS

## 2011-02-10 LAB — MAGNESIUM: Magnesium: 1.7 mg/dL (ref 1.5–2.5)

## 2011-02-10 LAB — CARDIAC PANEL(CRET KIN+CKTOT+MB+TROPI): Troponin I: 0.05 ng/mL (ref 0.00–0.06)

## 2011-02-15 HISTORY — PX: NM MYOCAR PERF WALL MOTION: HXRAD629

## 2011-02-25 LAB — CBC
HCT: 38.9 % (ref 36.0–46.0)
Hemoglobin: 13.2 g/dL (ref 12.0–15.0)
RBC: 4.04 MIL/uL (ref 3.87–5.11)
RDW: 13 % (ref 11.5–15.5)

## 2011-02-25 LAB — DIFFERENTIAL
Basophils Absolute: 0 10*3/uL (ref 0.0–0.1)
Eosinophils Relative: 1 % (ref 0–5)
Lymphocytes Relative: 19 % (ref 12–46)
Monocytes Absolute: 0.4 10*3/uL (ref 0.1–1.0)
Monocytes Relative: 8 % (ref 3–12)

## 2011-03-06 LAB — CBC
MCV: 93 fL (ref 78.0–100.0)
Platelets: 156 10*3/uL (ref 150–400)
RBC: 4.03 MIL/uL (ref 3.87–5.11)
WBC: 5.7 10*3/uL (ref 4.0–10.5)

## 2011-03-06 LAB — COMPREHENSIVE METABOLIC PANEL
ALT: 19 U/L (ref 0–35)
AST: 33 U/L (ref 0–37)
Albumin: 3.6 g/dL (ref 3.5–5.2)
CO2: 26 mEq/L (ref 19–32)
Calcium: 9.2 mg/dL (ref 8.4–10.5)
Chloride: 96 mEq/L (ref 96–112)
GFR calc Af Amer: >60 mL/min (ref >60)
GFR calc non Af Amer: >60 mL/min (ref >60)
Sodium: 129 mEq/L — ABNORMAL LOW (ref 135–145)
Total Bilirubin: 0.5 mg/dL (ref 0.3–1.2)

## 2011-03-06 LAB — BASIC METABOLIC PANEL
Calcium: 8.9 mg/dL (ref 8.4–10.5)
GFR calc Af Amer: >60 mL/min (ref >60)
GFR calc non Af Amer: >60 mL/min (ref >60)
Glucose, Bld: 92 mg/dL (ref 70–99)
Potassium: 3.5 mEq/L (ref 3.5–5.1)
Sodium: 133 mEq/L — ABNORMAL LOW (ref 135–145)

## 2011-03-06 LAB — BLOOD GAS, ARTERIAL
Bicarbonate: 26 mEq/L — ABNORMAL HIGH (ref 20.0–24.0)
O2 Saturation: 95.5 %
pCO2 arterial: 37.9 mmHg (ref 35.0–45.0)
pO2, Arterial: 74 mmHg — ABNORMAL LOW (ref 80.0–100.0)

## 2011-03-06 LAB — CARDIAC PANEL(CRET KIN+CKTOT+MB+TROPI)
CK, MB: 6.2 ng/mL — ABNORMAL HIGH (ref 0.3–4.0)
Relative Index: 4.5 — ABNORMAL HIGH (ref 0.0–2.5)
Total CK: 139 U/L (ref 7–177)
Troponin I: <0.01 ng/mL (ref 0.00–0.06)

## 2011-03-06 LAB — BUN: BUN: 13 mg/dL (ref 6–23)

## 2011-03-06 LAB — CREATININE, SERUM
Creatinine, Ser: 0.66 mg/dL (ref 0.4–1.2)
GFR calc Af Amer: >60 mL/min (ref >60)
GFR calc non Af Amer: >60 mL/min (ref >60)

## 2011-03-13 ENCOUNTER — Encounter: Payer: Self-pay | Admitting: Internal Medicine

## 2011-03-13 ENCOUNTER — Ambulatory Visit (INDEPENDENT_AMBULATORY_CARE_PROVIDER_SITE_OTHER): Payer: Medicare Other | Admitting: Internal Medicine

## 2011-03-13 VITALS — BP 118/68 | HR 68 | Temp 97.6°F | Resp 14 | Wt 130.5 lb

## 2011-03-13 DIAGNOSIS — T887XXA Unspecified adverse effect of drug or medicament, initial encounter: Secondary | ICD-10-CM

## 2011-03-13 DIAGNOSIS — I10 Essential (primary) hypertension: Secondary | ICD-10-CM

## 2011-03-13 DIAGNOSIS — R413 Other amnesia: Secondary | ICD-10-CM

## 2011-03-13 DIAGNOSIS — IMO0002 Reserved for concepts with insufficient information to code with codable children: Secondary | ICD-10-CM | POA: Insufficient documentation

## 2011-03-13 DIAGNOSIS — N318 Other neuromuscular dysfunction of bladder: Secondary | ICD-10-CM

## 2011-03-13 NOTE — Progress Notes (Signed)
Subjective:    Patient ID: [name], female    DOB: 08-27-1922, 77 y.o.   MRN: [id number]  Hypertension This is a chronic problem. The current episode started more than 1 year ago. The problem has been gradually improving since onset. The problem is controlled. Pertinent negatives include no anxiety, blurred vision, chest pain, headaches, malaise/fatigue, neck pain, orthopnea, palpitations, peripheral edema, PND, shortness of breath or sweats. There are no associated agents to hypertension. Past treatments include calcium channel blockers and angiotensin blockers. The current treatment provides significant improvement. There are no compliance problems.    She returns with her son and they tell me that a doctor at [facility] started her on Namenda about a week ago and she did not tolerate it due to side effects (dizzy, weak, disoriented), she stopped taking namenda 4 days ago and is back to normal now. Her son thinks she thinks too many meds and he expresses a wish for her to stop taking trazodone for insomnia but she likes it and says it helps with insomnia. She sleeps 6-7 hours per night, gets up 1-2 times to urinate and does not sleep during the day.    Review of Systems  Constitutional: Negative for fever, chills, malaise/fatigue, diaphoresis, activity change, appetite change, fatigue and unexpected weight change.  HENT: Negative for hearing loss, facial swelling, trouble swallowing, neck pain, neck stiffness, voice change and tinnitus.   Eyes: Negative for blurred vision.  Respiratory: Negative for cough, choking, chest tightness, shortness of breath, wheezing and stridor.   Cardiovascular: Negative for chest pain, palpitations, orthopnea, leg swelling and PND.  Gastrointestinal: Negative for nausea, vomiting, abdominal pain, diarrhea, constipation, blood in stool and abdominal distention.  Genitourinary: Negative for dysuria, urgency, frequency, hematuria, decreased urine volume, difficulty  urinating and dyspareunia.  Musculoskeletal: Negative for myalgias, back pain, joint swelling, arthralgias and gait problem.  Skin: Negative for color change, pallor and rash.  Neurological: Negative for dizziness, tremors, seizures, syncope, facial asymmetry, speech difficulty, weakness, light-headedness, numbness and headaches.  Hematological: Negative for adenopathy. Does not bruise/bleed easily.  Psychiatric/Behavioral: Positive for sleep disturbance (some awakenings). Negative for suicidal ideas, hallucinations, behavioral problems, confusion, self-injury, dysphoric mood, decreased concentration and agitation. The patient is not nervous/anxious and is not hyperactive.        Objective:   Physical Exam  Constitutional: She is oriented to person, place, and time. She appears well-developed and well-nourished. No distress.  HENT:  Head: Normocephalic and atraumatic.  Right Ear: External ear normal.  Left Ear: External ear normal.  Nose: Nose normal.  Mouth/Throat: Oropharynx is clear and moist. No oropharyngeal exudate.  Eyes: Conjunctivae and EOM are normal. Pupils are equal, round, and reactive to light. Left eye exhibits no discharge. No scleral icterus.  Neck: Normal range of motion. Neck supple. No JVD present. No thyromegaly present.  Cardiovascular: Normal rate, regular rhythm, normal heart sounds and intact distal pulses.  Exam reveals no gallop and no friction rub.   No murmur heard. Pulmonary/Chest: Effort normal and breath sounds normal. No respiratory distress. She has no wheezes. She has no rales. She exhibits no tenderness.  Abdominal: Soft. Bowel sounds are normal. She exhibits no distension and no mass. There is no tenderness. There is no rebound and no guarding.  Musculoskeletal: Normal range of motion. She exhibits no edema and no tenderness.  Lymphadenopathy:    She has no cervical adenopathy.  Neurological: She is alert and oriented to person, place, and time. She has  normal reflexes. She  displays normal reflexes. No cranial nerve deficit. She exhibits normal muscle tone. Coordination normal.  Skin: Skin is warm and dry. No rash noted. She is not diaphoretic. No erythema. No pallor.  Psychiatric: She has a normal mood and affect. Her behavior is normal. Judgment and thought content normal.       Lab Results  Component Value Date   WBC 7.9 10/31/2010   HGB 13.3 10/31/2010   HCT 37.2 10/31/2010   PLT 172.0 10/31/2010   ALT 15 10/31/2010   AST 27 10/31/2010   NA 137 10/31/2010   K 4.5 10/31/2010   CL 99 10/31/2010   CREATININE 0.7 10/31/2010   BUN 22 10/31/2010   CO2 31 10/31/2010   TSH 1.70 10/31/2010   INR 1.05 07/18/2010   HGBA1C  Value: 5.3 (NOTE) The ADA recommends the following therapeutic goal for glycemic control related to Hgb A1c measurement: Goal of therapy: <6.5 Hgb A1c  Reference: American Diabetes Association: Clinical Practice Recommendations 2010, Diabetes Care, 2010, 33: (Suppl  1). 11/27/2009      Assessment & Plan:

## 2011-03-13 NOTE — Assessment & Plan Note (Signed)
unchanged

## 2011-03-13 NOTE — Assessment & Plan Note (Signed)
Her BP is well controlled 

## 2011-03-13 NOTE — Assessment & Plan Note (Signed)
I told them hat her memory loss is age related and that I do not think she would benefit from a medicine like namenda, her son will continue to monitor her and provide a safe and supportive environment

## 2011-03-13 NOTE — Assessment & Plan Note (Signed)
namenda has been stopped and she has recovered with no lasting effects, will follow for any further problems

## 2011-03-13 NOTE — Patient Instructions (Signed)

## 2011-04-09 NOTE — Discharge Summary (Signed)
[name], [name]               ACCOUNT NO.:  [id number]   MEDICAL RECORD NO.:  [id number]           PATIENT TYPE:   LOCATION:                                 FACILITY:   PHYSICIAN:  Abelino Derrick, P.A.   DATE OF BIRTH:  Dec 10, 1921   DATE OF ADMISSION:  DATE OF DISCHARGE:                               DISCHARGE SUMMARY   ADDENDUM   [name] has been instructed to resume her lisinopril.      Abelino Derrick, P.ALenard Mckinney  D:  07/14/2008  T:  07/14/2008  Job:  [id number]

## 2011-04-09 NOTE — Cardiovascular Report (Signed)
NAME:  [name], [name] NO.:  [id number]   MEDICAL RECORD NO.:  [id number]          PATIENT TYPE:  OIB   LOCATION:  4705                         FACILITY:  [facility]   PHYSICIAN:  Nanetta Batty, M.D.   DATE OF BIRTH:  03/27/1922   DATE OF PROCEDURE:  DATE OF DISCHARGE:                            CARDIAC CATHETERIZATION   Ms. Steinhaus is an 77 year old Caucasian female with history of  noncritical CAD, hypertension, hyperlipidemia, tachybrady syndrome,  status post pacemaker implant.  She has severe claudication with  diminished ABIs.  She did have a CT angiogram, which showed SFA and  tibial vessel disease.  She presents for angiography and potential  intervention for relief of symptoms of claudication.   PROCEDURE DESCRIPTION:  The patient was brought to the Second Floor  [facility] in the postabsorptive state.  She  was premedicated with p.o. Valium.  The right groin was prepped and  shaved in the usual sterile fashion.  Xylocaine 1% was used for local  anesthesia.  A 5-French sheath was inserted into the left femoral artery  using standard Seldinger technique.  A 5-French tennis-racket catheter  was used for midstream and distal abdominal aortography with bifemoral  runoff using digital subtraction bolus-chase step table technique.  Visipaque dye was used for the entirety of the case.  Retrograde aortic  pressure was monitored during the case.   ANGIOGRAPHIC RESULTS:  1. Abdominal aorta.      a.     Renal arteries - 40% right renal artery stenosis.      b.     Infrarenal abdominal aorta; moderate atherosclerotic       changes.  2. 2.  Left lower extremity;      a.     80% segmental diffuse distal left SFA disease in the       adductor canal with intraluminal calcific, exophytic plaque.      b.     Two-vessel runoff via the peroneal and anterior tibial.  The       anterior tibial was diffusely diseased with high-grade proximal  segmental stenosis in the 90-95% range.  3. Right lower extremity;      a.     Total moderately long calcific segmental occlusion in the       adductor canal with reconstitution in the above-the-knee popliteal       by collaterals.      b.     One-vessel runoff through the peroneal.   IMPRESSION:  Ms. Spare has high-grade superficial femoral artery and  tibial vessel disease, but I do not believe she is a intervention  candidate either surgically or percutaneously.  Continue medical therapy  with vasodilators, Pletal will be recommended.   Sheath was removed and pressure was held on the groin to achieve  hemostasis.  The patient left the lab in stable condition.  Here she  will be gently hydrated, discharged home __________as an outpatient, and  will see me back in the office in approximately 2 weeks for followup.  She left the lab in stable condition.  Nanetta Batty, M.D.  Electronically Signed     JB/MEDQ  D:  07/12/2008  T:  07/13/2008  Job:  [id number]   cc:   Second [facility]  [facility]  Gaspar Garbe B. Little, M.D.

## 2011-04-09 NOTE — Discharge Summary (Signed)
NAMEGWENITH, TSCHIDA               ACCOUNT NO.:  [id number]   MEDICAL RECORD NO.:  [id number]          PATIENT TYPE:  INP   LOCATION:  4705                         FACILITY:  [facility]   PHYSICIAN:  Nanetta Batty, M.D.   DATE OF BIRTH:  February 18, 1922   DATE OF ADMISSION:  07/12/2008  DATE OF DISCHARGE:  07/14/2008                               DISCHARGE SUMMARY   DISCHARGE DIAGNOSES:  1. Peripheral vascular disease with PV angiogram this admission      showing bilateral lower extremity disease to be treated medically.  2. Coronary disease with prior left anterior descending stenting in      January 2009, which was patent in April 2009.  3. Treated hypertension.  4. Treated dyslipidemia.  5. History of paroxysmal atrial fibrillation with sick sinus syndrome      status post permanent pacemaker implant in the past.  6. Degenerative joint disease with chronic back and neck pain.  7. Urinary retention, stable at discharge.   HOSPITAL COURSE:  The patient is an 77 year old female followed by Dr.  Clarene Duke.  She has a history as noted above.  She saw Dr. Clarene Duke in June.  She had been having some pain in her legs consistent with claudication.  Outpatient Dopplers of the lower extremities revealed diminished ABIs,  0.45 on the right and 0.8 on the left.  Initially, she declined to have  an intervention or angiogram.  She later decided to proceed with this  after seeing Dr. Clarene Duke on 06/21/2008.  Please see his office note for  complete details.  The patient was admitted for same-day angiogram.  This was done on 07/12/2008.  The patient had a 40% right renal artery  stenosis.  There was an 80% diffusely diseased distal left SFA with two-  vessel runoff.  Right lower extremity showed a total calcified adductor  canal with reconstruction above-the-knee.  There was one-vessel runoff  via peroneal.  The patient had high-grade bilateral SFA disease and plan  is for medical therapy.  She did develop some  urinary retention during  her hospitalization.  She was seen in consult by Dr. Earlene Plater who has seen  her in the past and she has had prior urethral dilatations.  The patient  was treated conservatively and was able to void.  We feel she can be  discharged on 07/14/2008.  She was put on Flomax.  She will follow up  with Dr. Earlene Plater.  She was given a dose of Cipro.   DISCHARGE MEDICATIONS:  1. Isosorbide mononitrate 20 mg b.i.d.  2. Simvastatin 80 mg at bedtime.  3. Benicar 40 mg a day.  4. Celebrex 200 mg a day.  5. Aspirin 81 mg a day.  6. Plavix 75 mg a day.  7. Coreg 6.25 b.i.d.  8. Pletal 50 mg b.i.d.  9. Norvasc 10 mg a day.  10.Potassium 20 mEq a day.  11.Hydrochlorothiazide 12.5 mg a day.  12.Timoptic eye drops daily.  13.Trazodone 75 mg at bedtime.  14.Prilosec 20 mg a day.  15.Flomax 0.4 mg a day.  16.Cipro 500 mg p.o. x1.  LABORATORY DATA:  Sodium 129, potassium 3.6, BUN 19, and creatinine  0.64.  White count 5.4, hemoglobin 11.4, hematocrit 34.3, and platelets  131.  Urine culture is pending.  Urinalysis shows no nitrites, no  leukocytes, no wbc's, and trace blood.  Chest x-ray, July 07, 2008,  shows cardiomegaly, no acute findings.  Telemetry shows ventricular-  paced rhythm.   DISPOSITION:  The patient is discharged in stable condition.  She will  follow up with Dr. Earlene Plater and Dr. Allyson Sabal.      Abelino Derrick, P.A.      Nanetta Batty, M.D.  Electronically Signed    LKK/MEDQ  D:  07/14/2008  T:  07/15/2008  Job:  [id number]   cc:   Windy Fast L. Earlene Plater, M.D.  Thereasa Solo. Little, M.D.

## 2011-04-09 NOTE — Cardiovascular Report (Signed)
[name], [name]               ACCOUNT NO.:  [id number]   MEDICAL RECORD NO.:  [id number]          PATIENT TYPE:  OIB   LOCATION:  2807                         FACILITY:  [facility]   PHYSICIAN:  Thereasa Solo. Little, M.D. DATE OF BIRTH:  07/03/1922   DATE OF PROCEDURE:  12/09/2007  DATE OF DISCHARGE:                            CARDIAC CATHETERIZATION   INDICATIONS FOR TEST:  This 77 year old female presented with complaints  resting neck pain responsive to nitroglycerin.  Her EKG was  unremarkable, but it was clearly consistent with angina, and, because of  this, she was brought in for outpatient cardiac catheterization.   After obtaining informed consent from the patient, and I discussed this  in detail with her son, she was brought to the catheterization lab.  A 6-  French introducer sheath was placed in the right femoral groin following  local anesthetic with 1% Xylocaine.  The O.35 guidewire would not go up  her iliacs. Because of this, a glide wire was used to navigate her  iliacs and her descending aorta.  Following this, a 6-French left  coronary catheter was placed, and selective left coronary arteriography  was performed.  Using guide wire exchange technique, the right coronary  catheter was placed and evaluation of the right coronary artery was  performed.   Each time the catheter was aspirated, there were large volumes of  atheromatous-appearing material noted.   There was high-grade stenosis in her LAD, and, because of this, no  ventriculogram and no distal angiogram was performed.  This data can be  obtained noninvasively.   COMPLICATIONS:  None.   EQUIPMENT:  6-French Judkins configuration catheters.   TOTAL CONTRAST USED:  175 mL   MEDICATIONS:  3 mg of IV Versed, IV Angiomax, and 600 mg p.o. Plavix.   RESULTS:   HEMODYNAMIC MONITORING:  Her central aortic pressure was 170/70.   CORONARY ARTERIOGRAPHY:  On fluoroscopy there was dense calcification of  the left  main, the LAD,  her aortic root and her iliacs, particularly  the right iliac was diffusely calcified.   1. Left main:  The left main showed mild irregularities.  2. Circumflex.  The circumflex gave rise to two OM vessels that had      mild irregularities.  3. Optional diagonal:  Minimal irregularities.  4. LAD:  The LAD extended down to the apex of the heart and was      calcified throughout the proximal half.  There was a 90% area of      narrowing about 12 mm in length.  This started around the septal      perforator.  The diagonal was not involved, and the diagonal was      small but free of disease.  The distal LAD had mild irregularities.  5. Right coronary artery.  The right coronary artery had mild mid      irregularities.  The PDA was free of disease.   Because of the high-grade stenosis in her LAD, arrangements were made  for intervention.  The patient was given IV Angiomax.  A JL-4 6-French  guide catheter  was used and a short Luge wire.  The Luge wire was placed  down the LAD without difficulty.  A PROMUS stent 2.5 x 18 was made  ready.  I could not get it to cross into the midportion of the LAD, and  I am not sure I ever reached the area of obstruction.  When I pulled the  stent balloon out, the stent was still in place but was severely damaged  and fell off the balloon once it was outside of the catheter.   At this point, I predilated the area of obstruction with a Maverick 2.0  x 12 balloon, two inflations, 8 for 40 and 7 atmospheres for 30 seconds  were performed.  The area appeared to be only about 40% narrowed after  this, although still densely calcified.  I did not see a dissection.   A TAXUS 2.25 x 16 sent was then made ready, but it, too, would not cross  through the left main into the area of the obstruction.  I was finally  able to use an Atom stent 2.5 x 16.  This stent passed through the left  main into the midportion of the LAD, and it was placed in such a  manner  that both the proximal and distal areas of the obstructions were well  covered.  It was initially deployed at 16 atmospheres for 30 seconds,  then 14 atmospheres for 32 seconds, with a final inflation being 15  atmospheres for 30 seconds.   The area that had been 90% narrowed pre-intervention was now less than  0% narrowed and actually appeared to be slightly hyperexpanded.  There  was no evidence of any dissection, distal embolization, or thrombus  formation.  There was TIMI-3 flow both pre and post intervention.  Intracoronary nitroglycerin was given, but there was no change in the  appearance of the vessel.  The patient was given a total of 600 mg of  p.o. Plavix at the end of the procedure.  This was a type C lesion.   CONCLUSION:  1. Complex intervention to a densely calcified proximal left anterior      descending.  2. What appears to be significant right iliac disease.  I did not do      any type of angiogram to evaluate this.  This can be done      noninvasively  3. Diffuse atherosclerosis with marked atheroemboli aspirated through      the catheters.  4. Hypertension.           ______________________________  Thereasa Solo Little, M.D.     ABL/MEDQ  D:  12/09/2007  T:  12/09/2007  Job:  [id number]   cc:   [facility]  Catheterization Lab

## 2011-04-09 NOTE — Discharge Summary (Signed)
[name], [name]               ACCOUNT NO.:  [id number]   MEDICAL RECORD NO.:  [id number]          PATIENT TYPE:  INP   LOCATION:  4707                         FACILITY:  [facility]   PHYSICIAN:  Thereasa Solo. Little, M.D. DATE OF BIRTH:  07-03-1922   DATE OF ADMISSION:  03/20/2008  DATE OF DISCHARGE:  03/23/2008                               DISCHARGE SUMMARY   DISCHARGE DIAGNOSES:  1. Chest pain.  Negative myocardial infarction and stable coronary      arteries, most likely gastrointestinal.  2. Coronary artery disease with history of stent to the LAD in January      2009.      a.     Patent stent to the left anterior descending.      b.     Small second diagonal with ostial disease.  We will treat       with medicine.  3. Labile blood pressure, currently stable.  4. Dyslipidemia.  5. Paroxysmal atrial fibrillation and sick sinus syndrome with      permanent pacemaker.  6. History of claudication with CT angio revealing occluded SFA and      popliteal, moderate disease in the right iliac.  7. Hypertension.  8. Osteoporosis.   DISCHARGE CONDITION:  Improved.   PROCEDURES:  Combined left heart cath by Dr. Julieanne Manson on March 22, 2008.   Please note that the EF was greater than 70% hyperdynamic.   DISCHARGE MEDICATIONS:  1. Isosorbide 20 mg 1 pill twice a day, new dose.  2. Lisinopril 20 mg half a tablet twice a day, new lower dose.  3. Simvastatin 80 mg daily.  4. Benicar 40 mg daily.  5. Celebrex 200 mg daily.  6. Aspirin 81 mg daily.  7. Hydrochlorothiazide 12.5 mg daily, hold for now.  8. Plavix 75 mg daily.  9. Prilosec 20 mg twice a day.  10.Coreg 12.5 mg twice a day.  11.Pletal 50 mg twice a day.  12.Norvasc 10 mg daily.  13.KCl 20 mEq, hold.  14.Trazodone 100 mg at bedtime.  15.Timoptic eye drops as before.  16.Nitroglycerin under the tongue if needed for chest pain, as before.  17.ICaps, as before.   DISCHARGE INSTRUCTIONS:  1. Low-sodium, heart-healthy,  and low-sugar diet.  2. Wash cath site with soap and water.  Call us if any bleeding,      swelling, or drainage.  3. Increase activity slowly.  4. May shower.  5. No lifting for 2 days.  She is to not drive.  6. Follow up with Dr. Clarene Duke in June.  The office will call with date      and time.  7. Call Dr. Kinnie Scales, which she plans to do today and setup an      appointment to rule out reflux issues.  8. Stop taking Fosamax.   HISTORY OF PRESENT ILLNESS:  An 77 year old white female with history of  coronary disease status post stent to the LAD and Adams stent in January  2009.  She has peripheral vascular disease, hypertension, and  dyslipidemia.  She presented to [facility] ER on  March 20, 2008, with  complaints of dyspnea on exertion and throat tightness which occurs with  any activity.  This has been intermittent.  Over the last week, symptoms  have become more frequent and more severe.  Mostly, her tightness in her  throat is relieved with Nitro and on March 07, 2008, she saw Dr. Clarene Duke  and her Prilosec was increased to twice a day and her Lopressor was  changed to Coreg secondary to significant fatigue.  She saw Dr. Allyson Sabal on  March 14, 2008, for her peripheral vascular disease and was started on  isosorbide.  She has had no improvement in her symptoms.  Symptoms  occurred on March 20, 2008.  She came to the ER and was placed on  heparin and Nitro with plans to do her heart catheterization.   PAST MEDICAL HISTORY:  An Adams stent to the LAD in January 2009.  She  has a permanent transvenous pacemaker for PAF.   FAMILY HISTORY:  See H&P.   SOCIAL HISTORY:  See H&P.   REVIEW OF SYSTEMS:  See H&P.   PHYSICAL EXAMINATION:  At discharge by Dr. Clarene Duke; blood pressure 125/57  to 178/54, pulse 60, oxygen saturation 95% on room air, and temperature  97.3.  Minimal bruising at her cath site in the right groin.  Lungs are  clear.  Heart, regular rate and rhythm without murmur.    LABORATORY DATA:  Hemoglobin on admission 12.12 and at discharge it was  10.6, hematocrit 31.4 at discharge, and platelets 136, slightly lower.   Chemistry:  Sodium on admission 127, potassium 3.7, chloride 92, CO2 28,  BUN 12, creatinine 0.68, and glucose 114 and on March 21, 2008; sodium  129, potassium 3.5, chloride 97, CO2 25, BUN 17, creatinine 0.64, and  glucose 96.  Coags:  Pro-time 13.4, INR of 1, and PTT 26.  On heparin,  she was therapeutic.  AST 38, ALT 22, alkaline phos 41, total bili 0.7,  and albumin 3.7.  Target CK and MB; the initial CK was 188 with an MB of  6.4, troponin I negative x3 at 0.01, and CK is ranged at 120-95 and MB  is 2.9.   Calcium originally 8.9, magnesium 2.1, and glycohemoglobin 6.1.  TSH  2.998.   RADIOLOGY:  Chest x-ray on admission, no acute cardiopulmonary disease,  stable mild cardiomegaly, suboptimal inspiration accounts for mild right  basilar atelectasis, and pacemaker was in correct place.  EKGs, sinus  rhythm.  No acute changes.   HOSPITAL COURSE:  The patient was admitted by Dr. Jenne Campus on-call for  Dr. Clarene Duke secondary to unstable angina presenting new stats, is  confusing.  Nitrates somewhat helped her symptoms initially, but not  complete relief and medications as an outpatient have not helped her  throat tightness either.  She is on Fosamax and Dr. Clarene Duke is concerned  that is adding to her GI problems and causing her chest pain.  She has  history of coronary artery disease with stent to the LAD in 2009,  history of claudication, history of PAF and permanent pacemaker  maintaining sinus rhythm currently, hypertension, dyslipidemia, and  osteoporosis.  She was admitted to telemetry on IV heparin and  nitroglycerin.  Her enzymes were negative for myocardial infarction.  She continued to be stable during the hospitalization, and she was cath  on March 22, 2008.  Attempted cath on March 21, 2008, was a scheduling  conflict with the cath  lab.  The patient was cath  and did well with the  procedure.  No problems the next morning.  There was some disease that  will be treated with Isordil, but she will follow up with GI as well and  hold her Fosamax until she sees him.  Her sodium has been low here in  the hospital and her platelets were also somewhat lower before she was  discharged home.  She will need a basic metabolic panel and CBC as an  outpatient next week just to ensure these were stable.  I held her  hydrochlorothiazide for now, but that could be restarted next week if  her sodium is stable.  In the office, we will call her and arrange that  for her.      Darcella Gasman. Ingold, N.P.    ______________________________  Thereasa Solo Little, M.D.    LRI/MEDQ  D:  03/23/2008  T:  03/24/2008  Job:  [id number]   cc:   Griffith Citron, M.D.  Bertram Millard. Hyacinth Meeker, M.D.  Marjory Lies, M.D.

## 2011-04-09 NOTE — Discharge Summary (Signed)
NAMEVIVAN, [name]               ACCOUNT NO.:  [id number]   MEDICAL RECORD NO.:  [id number]          PATIENT TYPE:  OIB   LOCATION:  6525                         FACILITY:  [facility]   PHYSICIAN:  Thereasa Solo. Little, M.D. DATE OF BIRTH:  February 17, 1922   DATE OF ADMISSION:  12/09/2007  DATE OF DISCHARGE:                               DISCHARGE SUMMARY   DISCHARGE DIAGNOSES:  1. Angina, described as discomfort in throat, nitroglycerin      responsive.  2. Coronary artery disease with left anterior descending 98% calcium      at the septal perforator, mildly irregular distal right coronary      artery, mild irregularities.  The patient underwent Taxus Atom      Stent to the left anterior descending reducing 90% stenosis to less      than zero and TIMI free flow.  3. Abnormal troponin mildly elevated at 0.1.  4. Hypertension, currently hypotensive with medications held.  5. Hyperlipidemia with LDL 77, goal is less than 70.  6. Sick sinus syndrome, i.e. tachybrady syndrome with history of      permanent transvenous pacemaker pacing appropriately.  7. Hyponatremia.  8. Claudication symptoms.   DISCHARGE CONDITION:  Stable.   DISCHARGE MEDICATIONS:  1. Simvastatin was increased to 80 mg daily.  2. Benicar 40 mg daily.  3. Lopressor 50 mg twice a day.  4. Celebrex 200 mg daily.  5. Aspirin increased to 325 mg daily for one month or until sees Dr.      Clarene Duke.  6. Fosamax 70 mg weekly.  7. Lisinopril 20 mg daily.  8. Potassium 20 mEq daily.  9. Hydrochlorothiazide 12.5 daily.  10.Trazodone 150 mg daily.  11.Timoptic eye drops one drop both eyes every morning.  12.I-Caps one daily.  13.Norvasc 10 mg daily, currently she was asked to hold this      medication until we could see her as an outpatient or review her      blood pressures as an outpatient.  14.Plavix 75 mg one daily, do not stop, as it could cause a heart      attack.  15.Prilosec 20 mg over-the-counter daily.  16.Nitroglycerin sublingual as needed for chest pain as before.   DISCHARGE INSTRUCTIONS:  1. Low sodium heart healthy diet.  2. Increase activity slowly.  3. No lifting for 2 days.  4. No driving for 2 days.  5. Wash groin and calf site with soap and water, call if any bleeding,      swelling or drainage.  6. See Dr. Clarene Duke December 30, 2007 at 10:20 a.m.  7. Lower extremity arterial Dopplers should be done, the office will      call you with date and time.  8. Call our office on Friday to give the triage nurse your blood      pressures to see if we need to start the Norvasc.   HISTORY OF PRESENT ILLNESS:  This is an 77 year old female who came in  to see Dr. Clarene Duke the end of December and complained of angina described  as a discomfort at the base  of her throat that was nitroglycerin  responsive.  She takes nitroglycerin infrequently 2-4 a month for that.  It had increased recently.  Previously in 2005, her nuclear study had  been negative.  Due to her issues and her blood pressure was somewhat  elevated, Dr. Clarene Duke brought her to the cath lab for evaluation.  The  patient was seen 12/09/2007 in the cath lab, underwent combined left  heart cath and placement of Taxus Atom stent.  The procedure was  somewhat complex.  The former stent would not come off appropriately, it  was removed and then a Taxus stent would not cross.  Dr. Clarene Duke at that  time used an Atom stent 2.25 x16 with good placement reducing 90%  stenosis to zero in the LAD at the septal perforator.  The next morning  she had no complaints, she actually felt quite well, her troponin did  bump to 0.10, but she was ambulating, had no chest pain or throat pain  and actually felt better.  Her blood pressure was low down in the 90s,  that occurred during the night.  We decreased her Lopressor for one dose  to 25 mg daily and held her lisinopril and her hydrochlorothiazide this  morning.  I did give her Avapro for her Benicar  and the 25 of Lopressor.  She has no dizziness and no complaints at all.  She was seen by Dr.  Clarene Duke and felt ready for discharge.   PAST MEDICAL HISTORY:  1. Hypertension.  2. Hyperlipidemia.  3. Claudication symptoms.  4. New findings of coronary disease with anginal pain, hypertension,      difficulty to control at times.  5. Anxiety.  6. Chronic arthritis with known spinal cord stenosis and low back      pain.  She also complains of claudication with ambulation.  7. In the past she has had urinary incontinence with Cardura.  8. She has a history of tachybrady syndrome with a permanent      pacemaker, [employer] just placed December 2003.  Last interrogated      November 05, 2007.  9. She has osteoarthritis.  10.Glaucoma.  11.Gastroesophageal reflux disease.   FAMILY HISTORY:  See history and physical.   SOCIAL HISTORY:  See history and physical.   REVIEW OF SYSTEMS:  See history and physical.   ALLERGIES:  NEMBUTAL, ATROPINE. Intolerance to Cardura.   HOSPITAL COURSE:  As stated.   PROCEDURE:  On 12/09/2007 combined left cath by Dr. Clarene Duke.  On 12/10/2007 stent deployment to the LAD by Dr. Clarene Duke.   The patient will follow up as an outpatient.  We do need to A) Recheck  her labs in the next week because of her hyponatremia, but it was this  low previously.   CURRENT LABS POST PROCEDURE:  Troponin-I 0.10, sodium 128 had been 129  pre-procedure.  Potassium 3.8, chloride 93, CO2 25, BUN 13, creatinine  0.78, glucose 96, hemoglobin 12.9, hematocrit 37.4, WBC 6.7, platelets  150, calcium 8.7.  EKG post procedure sinus rhythm atrial pacing,  ventricular synching.  No acute changes from previous racing.      Darcella Gasman. Ingold, N.P.    ______________________________  Thereasa Solo Little, M.D.    LRI/MEDQ  D:  12/10/2007  T:  12/10/2007  Job:  [id number]   cc:   Thereasa Solo. Little, M.D.

## 2011-04-09 NOTE — Procedures (Signed)
EEG NUMBER:  [id number]   Currently in room 3703.   TECHNICIAN:  Harriett Sine.   REFERRING PHYSICIAN:  Dr. Clarene Duke.    This is a portable study performed in a patient apparently at bedside.  She is described as awake and drowsy, right-handed and was not exposed  to hyperventilation, but to photic stimulation.   MEDICATIONS:  Include isosorbide mononitrate, lisinopril, simvastatin,  Benicar, Celebrex, aspirin, Prilosec, Coreg, Pletal, Norvasc,  hydrochlorothiazide, Timoptic eyedrops, trazodone for sleep, and Zyrtec  p.r.n.   The patient is 77 year old admitted today with syncope, hypotension, and  shortness of breath.  She became diaphoretic, her eyes rolled back and  she briefly became unresponsive.  No convulsive activity is noted.   PAST MEDICAL HISTORY:  1. Brady-tachy arrhythmia with pacer stent placement.  2. Hypertension.  3. Hyperlipidemia.  This history lead to the deferral of the      hyperventilation maneuver, I suspect.   This a 16-channel EEG recording with one channel representing heart rate  and rhythm exclusively, was reviewed in several montages and documented  an irregular heartbeat with significant pacer artifact seen throughout  all leads of this EEG.  A posterior dominant rhythm was difficult to  establish as it is not noted as the patient's eyes are closed or open.  I counted a frequency of 9 Hz, which appears bilaterally in the same  frequency, but the amplitudes of the right occipital lobe are much  better and higher expressed than on the left.  This finding could be due  to electrode placement.  Photic stimulation showed photic entrainment,  this was sharp discharges at 5 and at 7 Hz, but the technician did not  note any clinical abnormalities and apparently talked to the patient  after photic stimulation was completed without noting any confusion or  delayed response.  Several times there is a mentioning of noise in the  room interfering with the EEG recording.   There is also frequent eye  blink artifact noted, although the patient seems occasionally to move.  There is no mentioning of the rhythmicity or focality of movements.   CONCLUSION:  This is an EEG that is likely the documenting medication-  induced abnormalities.  The EEG is of low amplitude and the occipital  rhythm is rather fast for patient of this age at 69.  The EKG is  definitely abnormal this is a variable R to R interval and the response  to photic stimulation was concerning.  There appears to be left frontal  and temporal slowing intermittently.  Again this would be only a valid  observation as the electrodes were actually placed by the interventional  10/20 modes.  Since the patient was the only admitted this morning, the  studies  may need to be repeated within 1 or 2 days delay and I would appreciate  a clinical observation by the technician as to the patient's  appropriateness, response, and affect.      Melvyn Novas, M.D.  Electronically Signed     [id number]  D:  03/22/2009 20:46:09  T:  03/23/2009 04:11:30  Job #:  [id number]   cc:   Thereasa Solo. Little, M.D.  Fax: [phone]

## 2011-04-09 NOTE — Consult Note (Signed)
[name], [name]               ACCOUNT NO.:  [id number]   MEDICAL RECORD NO.:  [id number]          PATIENT TYPE:  INP   LOCATION:  3703                         FACILITY:  [facility]   PHYSICIAN:  Levert Feinstein, MD          DATE OF BIRTH:  Apr 02, 1922   DATE OF CONSULTATION:  DATE OF DISCHARGE:                                 CONSULTATION   Consult is from Dr. Julieanne Manson for dizziness episode.   HISTORY OF PRESENT ILLNESS:  The patient is a very pleasant 77 year old  right-handed Caucasian female alone at bedside.  She has past medical  history of brady, tachycardia syndrome, at that time she was presenting  with shortness of breath, excessive fatigue with minimum exertion, has  received pacemaker in 2003, which did effectively relieve her symptoms.  She also had past medical history of hypertension, coronary artery  disease, hyperlipidemia, peripheral vascular disease, and  osteoarthritis.   She presents with 82-month history of intermittent dizziness, each  episode lasted about 2-3 minutes and similar presentation.  She was able  to count five episodes.   The most recent episode was at Dr. Gaspar Garbe Little's office, which led to  her admission.  It was 11 o'clock in the morning and Dr. Clarene Duke finished  examining her, she was sitting at the examining table, she began to  complain shortness of breath, dizziness sensation, and noticed her eyes  rolled back in her eyes and started to chew, became unresponsive and  lasted about 2-3 minutes and diaphoretic.  Pressure during the event was  80/60, heart rate was 61 and regular, oxygen saturation was 95%.  There  was no seizure activity noticed, no incontinence, tongue biting, no post  event confusion.  EKG at the end of event showed atrial paced,  ventricular sensed.   She was on multiple hypertension agents including isosorbide 20 mg  b.i.d., Benicar 40 mg a day, Coreg 12.5 mg b.i.d., and Norvasc 5 mg once  a day.  Since admission, there was  some readjustment of her medications.  She is only taking isosorbide 20 mg b.i.d.  There was no recurrent  episode.   She could account five episodes, the first episode about 2 months ago.  She finished lunch at [employer], and sitting in the chair, suddenly felt  lightheaded, woozy sensation, lasted about few minutes.  She never  totally lost consciousness.  The nurse who attended the gathering  checked her blood pressure, it was low, but she could not remember the  number, later she was able to walk without difficulty.   The second event was couple of weeks later again after lunch at  synagogue.   The third episode was after having lunch at restaurant, again she  experienced dizziness, lightheaded sensation, and she took tablets of  nitro and made the symptoms much worse, totally lost consciousness  afterwards, she gradually recovered after lying down.   The fourth episode was at home, she was standing fixing a salad for her  son-in-law and suddenly she felt dizzy and had a lightheaded sensation.  She went to  sit down and remembered the whole conversation of her son in-  law, who described to the paramedics that her eyes rolled back but she  denied whole body shaking episode, denied total loss of consciousness.   REVIEW OF SYSTEMS:  She denies chest pain, heart palpitation, and she  does have chronic neck pain and gait difficulty and chronic low back  pain.   PAST MEDICAL HISTORY:  1. Hypertension.  2. Coronary artery disease.  3. Hyperlipidemia.  4. Osteoarthritis.  5. Peripheral vascular disease.   FAMILY HISTORY:  Mother had hypertension, father had lung and renal  cancer.  She had two sons both with hypertension, one has  hyperlipidemia.   SOCIAL HISTORY:  She is widowed, has two children, unable to exercise,  denies smoking or drinking,   PHYSICAL EXAMINATION:  VITAL SIGNS:  Temperature 96.5, blood pressure  sitting down 156/79, heart rate of 61 standing up 151/82,  heart rate of  61.  CARDIAC:  Regular rate and rhythm.  PULMONARY:  Clear to auscultation bilaterally.  NECK:  Supple.  No carotid bruits.  NEUROLOGICAL:  She is mentally sharp, alert, and oriented x4, able to  provide detailed history.  No dysarthria.  No aphasia.   Cranial nerves II-XII, status post bilateral pupil surgical change,  right pupil was enlarged, irregular, left pupil sluggish reactive to  light.  Extraocular movements were full.  Facial sensation and strength  was normal.  Uvula and tongue midline.  Head turning and shoulder  shrugging normal and symmetric.  Tongue protrusion into cheek strength  was normal.   On motor examination she has kyphosis, but normal tone, bulk and  strength on four extremities.  Sensory lens dependent, decreased  vibratory sensation in distal leg.  Deep tendon reflexes are brisk at  bilateral upper extremity and patellar preserved Achilles reflex.  Plantar responses were extensor.  No dysmetria.  Normal finger-to-nose,  heel-to-shin.  Gait:  Cautious but fairly steady.  CT of the brain without contrast left cerebellum old stroke.  No new  events.   ASSESSMENT AND PLAN:  An 77 year old female, with syncope, each time  documented hypotensive, likely reflecting hypotension,decreased CNS  perfusion due to too much antihypertensive medications, or poor vascular  tone regulations, the semiology are less suggestive of seizure.   Agreed decrease hypertension medication agent, we will follow up on EEG,  No antiepileptic (anticonvulsant) drug is indicated.      Levert Feinstein, MD  Electronically Signed     YY/MEDQ  D:  03/24/2009  T:  03/25/2009  Job:  [id number]

## 2011-04-09 NOTE — Procedures (Signed)
EEG NUMBER:   CLINICAL HISTORY:  The patient is an 77 year old female who was admitted  for syncope with associated low blood pressure 80/40, she had recurrent  5 episodes in 2 months, also similar with associated hypotensive.  She  has a history of brady-tachy syndrome, pacemaker, hypertension,  hyperlipidemia, coronary artery disease.   CURRENT MEDICATIONS:  Isosorbide, aspirin, Pletal, Colace, Timoptic.   TECHNICAL COMMENT:  An 16 channel EEG was performed on standard  international 10-20 system.  Total recording time is 22.5 minutes, One  channel dedicated to EKG which has demonstrated normal sinus rhythm of  66 beats per minute.   Upon awakening, posterior background activity was well developed, 9-10  Hz, symmetric, reactive to eye opening and closure.  There was no  evidence of epileptiform discharge.  The patient quickly drifting into  drowsiness, as evident by attenuation and mild dysrhythmic background  activity.   Photic stimulation with flash frequency 1-19 Hz was performed.  There  was symmetric photic driving at 5 Hz, no abnormality elicited.  Hyperventilation was attempted, no abnormality elicited.   The patient was able to achieve stage II sleep during the recording as  evident by vertex waves and sleep spindle.  There was no evidence of  epileptiform discharge.   CONCLUSION:  This is a normal awake and sleep EEG.  There was no  evidence of epileptiform discharge.      Levert Feinstein, MD  Electronically Signed     AO:ZHYQ  D:  03/24/2009 19:28:49  T:  03/25/2009 02:39:12  Job #:  [id number]

## 2011-04-09 NOTE — Cardiovascular Report (Signed)
NAMEPAULENE, [name]               ACCOUNT NO.:  [id number]   MEDICAL RECORD NO.:  [id number]          PATIENT TYPE:  INP   LOCATION:  4707                         FACILITY:  [facility]   PHYSICIAN:  Thereasa Solo. Little, M.D. DATE OF BIRTH:  1922-05-10   DATE OF PROCEDURE:  03/22/2008  DATE OF DISCHARGE:                            CARDIAC CATHETERIZATION   INDICATIONS:  This 77 year old female had an Atom stent placed in her  proximal LAD in January 2008.  She has recently been experiencing some  recurrent episodes of chest discomfort somewhat atypical in nature, but  occasionally nitroglycerin responsive.  She has degenerative disk  disease, is in the process of trying to have epidural injections  performed, but because of the chest discomfort, was admitted on March 20, 2008.  Her cardiac markers have been negative, and her EKG shows  nonspecific T-wave abnormalities.  She does have a permanent pacemaker.   The patient is brought to the cath lab for reevaluation of her coronary  anatomy.   PROCEDURE:  The patient was prepped and draped in the usual sterile  fashion exposing the right groin.  Following local anesthetic of 1%  Xylocaine, the Seldinger technique was employed.  A 5-French introducer  sheath was placed in the right femoral artery.  A Glidewire was used to  navigate the right iliac stent and then wire exchange technique was  performed after that.   EQUIPMENT:  A 5-French and Judkins configuration catheters.   TOTAL CONTRAST USED:  80 mL.   COMPLICATIONS:  None.   RESULTS:  1. Hemodynamic monitoring.  Central aortic pressure was 176/64.  Left      ventricular pressure was 168/4.  There was no significant gradient      noted at the time of pullback.  2. Ventriculography.  Ventriculography in the RAO projection revealed      the LV function to be hyperdynamic with an ejection fraction excess      of 70%.  There was mild left ventricular hypertrophy.  The left  ventricular end-diastolic pressure was 23.  No mitral regurgitation      was appreciated.  3. Coronary arteriography:  There is calcification on fluoroscopy and      distribution of left main in the LAD and you could see the stent in      the proximal LAD.   1. Left main normal.  2. Circumflex.  The circumflex bifurcated very proximally into 2 twin      OM vessels, all of which were free of disease.  3. LAD.  The LAD crossed the apex of the heart.  There was a stent      that was widely patent in the proximal portion of the LAD.  Just      proximal to the stent was an area of 20% narrowing.  The distal LAD      had mild irregularities.  The first diagonal had ostial and      proximal 40% narrowing.  The second diagonal had ostial 90%      narrowing.  This was a very small vessel.  This is not amenable to      any type of intervention.  It should be managed with medical      therapy only.  There is also a distal perforator with proximal 30%-      40% area of some narrowing.  4. Right coronary artery.  This is a large dominant vessel.  There is      proximal 20%-30% areas.  There is a large PDA in the small      posterior lateral vessel.   CONCLUSION:  1. Hyperdynamic left ventricle with an ejection fraction excess of      60%.  2. Mild left ventricular hypertrophy.  3. Elevated left ventricular end-diastolic pressure of 23.  4. Widely patent stent in the proximal left anterior descending      artery.  5. High-grade stenosis in the ostium of very small second diagonal      branch with mild disease in other vessels.   There is clearly nothing that needs to be addressed from interventional  revascularization type standpoint.  Medical therapy will be increased.  I have increased her isosorbide today to 20 mg b.i.d.  Her pressure has  been quite well controlled despite being slightly elevated this morning.  She is already on Coreg 12.5 mg b.i.d., Norvasc 10, and lisinopril 20.  I will  try to increase her Coreg, if her blood pressure and pulse allow.           ______________________________  Thereasa Solo Little, M.D.     ABL/MEDQ  D:  03/22/2008  T:  03/23/2008  Job:  [id number]   cc:   [name], M.D.

## 2011-04-09 NOTE — Consult Note (Signed)
NAMECALIN, [name]               ACCOUNT NO.:  [id number]   MEDICAL RECORD NO.:  [id number]          PATIENT TYPE:  OIB   LOCATION:  4705                         FACILITY:  [facility]   PHYSICIAN:  Sigmund I. Patsi Sears, M.D.DATE OF BIRTH:  08-26-1922   DATE OF CONSULTATION:  07/13/2008  DATE OF DISCHARGE:                                 CONSULTATION   CHIEF COMPLAINT:  Urinary retention.   [name] is a known patient to our office.  She was last seen in our  office on July 14, 2007.  She has been periodically dilated, and was  recently dilated up to a 30-French [employer] sound.  Today, she is  status post cardiac cath, had a Foley removed earlier today and has been  unable to void for several hours.  The patient was able to void  spontaneously several hours ago-300 mL and then there was an in-and-out  cath performed for PVR, which showed 300 mL. Urine was sent for UA and  C&S.  The patient is ambulatory and is sitting on the side of the bed at  this time, eating her supper.  At 17:30, the patient was able to void  another 300 mL spontaneously.  PVR was not able to be obtained within 15  minutes, making pvr measurement less exact.   ALLERGIES:  1. ATROPINE.  2. NEMBUTAL.   MEDICATIONS:  1. Valium 5 mg one p.o.  2. Isosorbide mononitrate 20 mg p.o. b.i.d.  3. Lisinopril is 10 mg p.o. daily.  4. Zocor 80 mg p.o. daily.  5. Benicar 40 mg p.o. daily.  6. Celebrex 200 mg p.o. daily.  7. Aspirin 81 mg p.o. daily.  8. Plavix 75 mg p.o. daily.  9. Coreg 6.25 mg b.i.d.  10.Pletal 50 mg b.i.d.  11.Norvasc 10 mg daily.  12.Potassium 20 mEq p.o. daily.  13.Hydrochlorothiazide 12.5 mg p.o. daily.  14.Timoptic 1 drop, both eyes daily.  15.Desyrel 75 mg p.o. nightly.   SOCIAL HISTORY:  Widowed with 2 children.  She lives alone.   FAMILY HISTORY:  Her mother has a history of hypertension and  cholecystitis.  Father had a history of colon and lung cancer.   REVIEW OF SYSTEMS:   Significant for urinary retention and inability to  void.  All other systems were reviewed with the patient and were  negative.   PHYSICAL EXAMINATION:  VITAL SIGNS:  Blood pressure is 194/96,  temperature is 98.4, heart rate 63, respirations 18, and room air sats  98.  GENERAL:  She is well-developed and well-nourished elderly white female,  in no acute distress.  HEENT:  Normocephalic and atraumatic.  NECK:  Supple and nontender.  No masses.  CARDIOVASCULAR:  Normal rhythm and rate.  PULMONARY:  Lungs clear to auscultation.  ABDOMEN:  Soft and nontender.  Positive bowel sounds x4 without  organomegaly or masses.  No CVA punch tenderness.  No HMS.  GU:  Normal female genitalia normal meatus and is midline.  EXTREMITIES:  Normal to inspection.  SKIN:  Normal to inspection.   LABORATORY DATA:  Urine culture is pending.  Urinalysis,  specific  gravity of 1.015 and pH is 6.5, it is microscopically negative.  BMP:  Sodium is 131, potassium is 3.4, chloride is 97, CO2 is 27, glucose 118,  BUN is 12 and creatinine 0.73.  CBC:  WBC is 5.4, RBC is 6.89,  hemoglobin is 11.4, hematocrit is 34.3, and platelet is 131.   ASSESSMENT:  Slowly resolving post cardiac catheterization urinary retention. The  patient will continue on prn intermittent catheterization if PVR is  greater than 300 mL.  If in-and-out catheterization is necessary more  than twice, we will plan to leave Foley in place.  The patient is  adamant that she not to go home with a Foleycatheter.  While refusing to  go home with a catheter, she  states that she will remain hospitalized  at this time-as long as she needs ctheterization. [name] also  refuses to be taught clean, self intermittent catheterization.   RECOMMENDATION:  The patient can be discharged-but if she is unable to  void spontaneously, and PVRs remain greater than 300, she will need to  be taught self-taught CIC . Please have pt  follow up in our office for   urodynamic study, with either the nurse practitioner or Dr. Earlene Plater.      Jetta Lout, NP      Sigmund I. Patsi Sears, M.D.  Electronically Signed    DW/MEDQ  D:  07/13/2008  T:  07/14/2008  Job:  [id number]

## 2011-04-09 NOTE — H&P (Signed)
NAMESENIA, EVEN               ACCOUNT NO.:  [id number]   MEDICAL RECORD NO.:  [id number]          PATIENT TYPE:  INP   LOCATION:  3703                         FACILITY:  [facility]   PHYSICIAN:  Thereasa Solo. Little, M.D. DATE OF BIRTH:  [birth year]   DATE OF ADMISSION:  03/22/2009  DATE OF DISCHARGE:                              HISTORY & PHYSICAL   [name] is 77 years old.  She is brought to my office today by her  son.  He relates several episodes of near syncope, all this started  since around February 07, 2009.  At that time, I had placed her empirically  on Singulair because of breathlessness despite relatively normal  pulmonary functions in July 2009.  Her breathing did not improve on the  Singulair, but with the spell starting he asked her to stop that  medication.  Initially, he felt that this resolved the problem.   She had complained of neck pain and was seen by orthopedist and placed  in a C-collar.  She feels like this is response for the change in her  episodes.  Of note, the neck pain is much better.   Today in my office, she came in for routine evaluation and had a blood  pressure of 140/80 and then while I was speaking with her son and the  patient she complained of increasing shortness of breath.  Her eyes  rolled back in her head.  She started to drool.  She became unresponsive  for 2-3 minutes.  She became diaphoretic.  Her pressure during this  event was 80/60.  Her heart rate was 61 and regular.  Her oxygen  saturations were 95%.  She had no seizure activity.  No incontinence and  no significant mental confusion following the event, but did not  remember the event at all.  An EKG done at the end of the event shows  atrial paced, ventricular sensed.   According her son, these episodes occur about 3 times per week.   From a cardiac standpoint, she has bradytachy syndrome and had a  permanent pacemaker placed on October 25, 2002.   She has a non-DES Adams stent  placed to her LAD on December 09, 2007.  At  that time, she had a normal ejection fraction.  She underwent a repeat  cath because of atypical chest pain in April 2009 that showed the stent  to be widely patent.   She has a history of hypertension.  She has a history of hyperlipidemia.  She had been on statin therapy and this was discontinued because of  worsening mental confusion.  Her mental confusion has not improved and  she actually is being evaluated at [facility] where her son is a professor  because of her short-term memory loss.   She has mild valvular heart disease with mild to moderate tricuspid  regurgitation and her estimated pulmonary artery pressure by that echo  dated February 01, 2009, was 35.   She had normal pulmonary function studies as baseline in July 2009.   She has a history of peripheral vascular disease and  is followed by Dr.  Allyson Sabal.  She has a history of diffuse arthritis.   OUTPATIENT MEDICATIONS:  1. Isosorbide 20 mg b.i.d.  2. Benicar 40 mg a day.  3. Celebrex 200 mg a day.  4. Aspirin 81.  5. Coreg 12.5 b.i.d.  6. Pletal 50 mg b.i.d.  7. Norvasc 10 mg half a tablet a day.  8. Timoptic once a day OU.  9. Zyrtec p.r.n..   I had seen her on February 07, 2009, and at that time her pressure was only  114/60 and I reduced her Norvasc from 10 mg to just 5 mg.  I also  stopped her lisinopril.   FAMILY HISTORY:  Mother had hypertension.  Father had lung and renal  cancer.  She has 2 sons with hypertension, one has hyperlipidemia.   SOCIAL HISTORY:  She is widowed, with 2 children.  She is unable to  exercise.  No tobacco products.  She is on appropriate reduced salt,  reduced fat diet.   REVIEW OF SYSTEMS:  She has seen no blood in her bowel movement, has  minimal puffiness of her ankles at the end of the day.  She has not had  a productive cough.  She has no prior history of COPD, but does have  chronic complaints of breathlessness.  According to her son, she  has had  progressive worsening with her mentation.  She denies any significant  problems at all and was adamant about not being admitted to the  hospital.   ALLERGIES:  [name] and [name].   PHYSICAL EXAMINATION:  VITAL SIGNS:  Blood pressure:  Pre-event, her  blood pressure sitting was 150/82 and standing 140/80 with a pulse of 56  and weight of 133 pounds; during the event, her pressure dropped to  80/60 with a pulse of 56 and was regular; and following the event, her  blood pressure was 120/70 with a pulse of 60.  SKIN:  Warm and dry before the event, but became pale and diaphoretic  during the event.  NECK:  She had a C-collar in place.  I did not remove the C-collar to  check her carotids, but she had no carotid bruits on previous  evaluation.  Her thyroid had been nonpalpable also.  LUNGS:  Clear with no rales or wheezes.  CARDIAC:  Regular heart rhythm, 1/6 systolic murmur.  No ectopic beats.  ABDOMEN:  Slightly distended, but nontender.  EXTREMITIES:  No edema and +2 pulses.  NEUROLOGIC:  She is a hard of hearing, has difficulty answering  questions and sometimes has difficulty grasping her words.   According to her son, he had taken her to [facility] and had a CT scan of  her head performed.  Nothing acute showed up.  She did have what  appeared to be an old CVA.   ASSESSMENT:  1. Near syncope witnessed in my office with associated drop in blood      pressure, transient loss of consciousness.  I have ordered an EEG.      We will interrogate her pacemaker to make sure she did not have an      arrhythmia.  I plan to stop all her blood pressure-lowering drugs      for now.  I ordered a BNP to make sure she is not having pulmonary      emboli.  2. Bradytachy syndrome with permanent pacemaker.  3. Coronary artery disease with non drug-eluting stent to her left  anterior descending.  4. Hyperlipidemia, currently not on statin therapy because of memory      loss.  5.  Mild mitral regurgitation and tricuspid regurgitation.  6. Peripheral vascular disease, followed by Dr. Allyson Sabal.  7. Hypertension.           ______________________________  Thereasa Solo Little, M.D.     ABL/MEDQ  D:  03/22/2009  T:  03/22/2009  Job:  [id number]   cc:   Frederica Kuster, MD

## 2011-04-12 NOTE — Discharge Summary (Signed)
[name], [name]               ACCOUNT NO.:  [id number]   MEDICAL RECORD NO.:  [id number]          PATIENT TYPE:  INP   LOCATION:  3011                         FACILITY:  [facility]   PHYSICIAN:  Clydene Fake, M.D.  DATE OF BIRTH:  1922/11/07   DATE OF ADMISSION:  07/18/2005  DATE OF DISCHARGE:  07/21/2005                                 DISCHARGE SUMMARY   PREOPERATIVE DIAGNOSIS:  Degenerative disk disease.   POSTOPERATIVE DIAGNOSIS:  Degenerative disk disease.   PROCEDURE:  Left L1-2 redo diskectomy, microdissection.   REASON FOR ADMISSION:  Patient is an 77 year old who has back and left leg  pain due to disk herniation left L1-2 for redo discectomy.   HOSPITAL COURSE:  The patient underwent surgery without complications.  Postop transferred to the floor.  Was having no leg pain.  Incisions were  clean, dry, and intact.  Foley was discontinued.  Dr. Venetia Maxon talked with  urology and they recommended that we leave Foley in place and followup with  Dr. __________ in [facility] as an outpatient.  The patient was discharged home on  July 21, 2005 in stable condition, much less leg pain.  Doing well,  incisions were intact.  Followup with urology.  Foley in the next week and  Dr. Venetia Maxon to followup 3-4 weeks postop.           ______________________________  Clydene Fake, M.D.     JRH/MEDQ  D:  09/26/2005  T:  09/27/2005  Job:  [id number]

## 2011-04-12 NOTE — Discharge Summary (Signed)
[name], [name]               ACCOUNT NO.:  [id number]   MEDICAL RECORD NO.:  [id number]          PATIENT TYPE:  INP   LOCATION:  3703                         FACILITY:  [facility]   PHYSICIAN:  Thereasa Solo. Little, M.D. DATE OF BIRTH:  [birth year]   DATE OF ADMISSION:  03/22/2009  DATE OF DISCHARGE:  03/25/2009                               DISCHARGE SUMMARY   DISCHARGE DIAGNOSES:  1. Loss of consciousness, transiently.      a.     EEG was normal.      b.     No arrhythmias on pacemaker.      c.     At times, orthostatic hypotension, at other times not.       Neurology recommended adjusting blood pressure medications to       target nocturnal supine hypertension to avoid a.m. hypotension,       and Jobst stockings were also recommended  2. Coronary artery disease with nondrug-eluting stent to her left      anterior descending.  3. Tachy-brady syndrome with permanent transvenous pacemaker.  4. Hyperlipidemia:  Not on statin because of memory loss with statins.  5. Mild mitral regurgitation and tricuspid regurgitation.  6. Peripheral vascular disease.  7. Hypertension.  8. Cervical disk disease with collar.   DISCHARGE CONDITION:  Improved.   PROCEDURES:  None.   DISCHARGE MEDICATIONS:  1. Benicar 20 mg daily, new dose, we decreased it from 40.  2. Coreg half of a 12.5 mg twice a day to equal 6.25 mg twice a day.  3. Celebrex 200 mg daily.  4. Aspirin 81 mg daily.  5. Plavix 75 mg daily.  6. Timoptic eye drops 1 drop both eyes daily.  7. Calcium daily.  8. ICAPS daily.  9. Move Free daily.  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  Stop isosorbide, Pletal, Norvasc, hydrochlorothiazide, and potassium.   DISCHARGE INSTRUCTIONS:  1. Low-sodium heart-healthy diet.  2. Increase activity slowly, may shower and no driving.  3. Follow up with Dr. Clarene Duke the week of discharge, the office will      call with date and time.   HISTORY OF PRESENT ILLNESS:  An 77 year old  female patient of Dr.  Clarene Duke, was admitted by Dr. Clarene Duke to [facility] after syncopal-type  episode in Dr. Fredirick Maudlin office.   She was sitting and began complaining of increasing shortness of breath.  Her eyes rolled back on her head, she started to drool, was unresponsive  for 2-3 minutes.  She became diaphoretic, and her blood pressure had  dropped to 80/60.  Her heart rate was 61 and regular.  Oxygen  saturations were 95%.  No seizure activity.  No incontinence.  No  significant mental confusion following this event but did not remember  the event at all.  EKG revealed atrial pacing and ventricular sensing.  The patient's son states she has these episodes 3 times per week.  Because of this, she was admitted to [facility] for further evaluation.   PAST MEDICAL HISTORY:  Coronary disease with a nonDES Adams stent placed  to LAD  in January 2009 with a normal EF.  Last cath was in April 2009  with widely patent stent.  She has a history of hypertension,  hyperlipidemia, unable to be on statin therapy because of worsening  mental confusion.  She has mild valvular heart disease and mild to  moderate TR, and her estimated pulmonary arterial pressure by echo on  February 01, 2009, was 35.  She has had a history of normal pulmonary  function.  She has peripheral vascular disease and diffuse arthritis and  now in her neck.   FAMILY HISTORY, SOCIAL HISTORY, REVIEW OF SYSTEMS:  See H and P.   PHYSICAL EXAMINATION ON DISCHARGE:  VITAL SIGNS:  Blood pressure lying  down 185/84, sitting 165/76, and standing 143/73. Pulse 60, respiratory  18, temperature 98, oxygen saturation 95% on room air.  Please note  previously on the 30th, she was not orthostatic with blood pressure  check.  HEART:  Without change.  LUNGS:  Without change.  ABDOMEN:  Without change.   LABORATORY VALUES:  On admission, hemoglobin 13.2, hematocrit 37.5,  platelets 156, WBC 5.7.  D-dimer was elevated at 0.74.  Sodium 129,  potassium 4.4, chloride 96, CO2 26, glucose 134, BUN 15,  creatinine 0.81.  Total bili 0.5, alkaline phosphatase 49, SGOT 33, SGPT 19, total protein  6.2, albumin 3.6, and calcium 9.2.  Cardiac marker was negative with CK 139, MB 6.2, troponin I less than  0.01.  TSH was 2.394.  Blood gases on 100%  pH 7.452, PCO2 37.9, PO2 74, bicarb 26, total CO2  27.2, and prior to discharge, sodium was 133, potassium 3.5, chloride  101, CO2 28, glucose 92, BUN 18, and creatinine 0.63.   RADIOLOGY:  CT angio of the chest due to elevated D-dimer, no evidence  of pulmonary embolism.  Cardiomegaly was present with coronary artery  disease, and chest x-ray, 2-view, cardiomegaly, no acute findings.   Please note, the patient also had a CT of her head on April 18 of this  year without acute reversible process.  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   EEGs:  Initial EEG on the 28the, was felt to be abnormal due to  medication-induced abnormalities, all medications were held.  The  patient had followup EEG, and it was a normal awake and sleep EEG.  There was no evidence of epileptiform discharge.   We also had Neurology see her and felt this was all related to  hypotension and decreased CNS perfusion due to antihypertensive  medications or poor vascular tone regulations.   Meds were adjusted.   HISTORY OF PRESENT ILLNESS:  The patient was admitted by Dr. Clarene Duke due  to syncope in the office.  The initial EEG at [facility] was somewhat  abnormal.  Her meds were held and followup was normal.  She had no  further episodes noted in the hospital of these syncopal episodes.  Prior to admission, her son states she had been having 3 a week.  We did  decrease her medications.  We stopped her Pletal, decreased the Benicar  to 20, decreased the Coreg, and discontinued her hydrochlorothiazide to  see if these would help improve her orthostatic hypotension and was  recommended she get Jobst stockings, and  prescription was written.  The  patient was quite anxious to go home and will follow up with Dr. Clarene Duke  closely in case we do need to increase some of the medications.      Darcella Gasman.  Ingold, N.P.    ______________________________  Thereasa Solo Little, M.D.    LRI/MEDQ  D:  03/27/2009  T:  03/28/2009  Job:  [id number]

## 2011-04-12 NOTE — Op Note (Signed)
[name], Mckinney               ACCOUNT NO.:  [id number]   MEDICAL RECORD NO.:  [id number]          PATIENT TYPE:  INP   LOCATION:  3011                         FACILITY:  [facility]   PHYSICIAN:  Danae Orleans. Venetia Maxon, M.D.  DATE OF BIRTH:  October 15, 1922   DATE OF PROCEDURE:  07/18/2005  DATE OF DISCHARGE:                                 OPERATIVE REPORT   PREOPERATIVE DIAGNOSIS:  Recurrent lumbar disk herniation, L1-2 left with L2  compression fracture, degenerative disk disease, and radiculopathy.   POSTOPERATIVE DIAGNOSIS:  Recurrent lumbar disk herniation, L1-2 left with  L2 compression fracture, degenerative disk disease, and radiculopathy.   OPERATION PERFORMED:  Left L1-2 redo microdiskectomy with microdissection.   SURGEON:  Danae Orleans. Venetia Maxon, M.D.   ASSISTANT:  Hewitt Shorts, M.D.   ANESTHESIA:  General endotracheal.   ESTIMATED BLOOD LOSS:  Minimal.   COMPLICATIONS:  None.   DISPOSITION:  Recovery.   INDICATIONS FOR PROCEDURE:  [name] is an 77 year old woman who had  previously undergone left L1-2 microdiskectomy. She did well after surgery,  then fell and developed a superior end plate compression fracture of L2.  She had a vertebroplasty which did not improve her symptoms.  She  subsequently underwent a myelogram and postmyelographic CT scan which  demonstrated significant disk recurrence at the L1-2 level along with some  superior end plate of L2 causing compression of the neural foramen at the L2  nerve root.  It was elected to take her to surgery for redo microdiskectomy  L1-2 left along with removal of some compressive bone.   DESCRIPTION OF PROCEDURE:  [name] was brought to the operating room.  Following the satisfactory and uncomplicated induction of general  endotracheal anesthesia and placement of intravenous lines and placement of  Foley catheter, the patient was placed in a prone position on the Wilson  frame.  The low back was then shaved,  prepped and draped in the usual  sterile fashion.  The area of planned incision was infiltrated with 0.25%  Marcaine, 0.5% lidocaine, 1:200,000 epinephrine.  The previous lumbar  incision was reopened carried to the left side of midline and this was  incised with electrocautery.  Subperiosteal dissection was then performed  exposing the L1 lamina and the previous laminotomy defect of L1-2.  After  confirming correct level with intraoperative x-ray, it was elected to  perform a more extensive laminectomy of L1 superiorly, but not to increase  the lateral decompression and a high speed drill was used under loupe  magnification to extend the laminectomy defect cephalad at L1.  This was  then completed with Kerrison rongeur.  Using a variety of angled curettes,  the hypertrophied ligamentous tissue was then removed and the lateral recess  was decompressed.  The microscope was brought into the field.  The L1  pedicle was identified and just inferior to the L1 pedicle and lateral to  the thecal sac, a large amount of recurrent disk material contained by  ligament was identified and this was removed with micropituitary and  microhook.  The lateral aspect of the thecal sac  was decompressed.  The L2  nerve root was decompressed as well but there was still residual shelf of  bone and with the use of an osteophyte tool, this ridge of bone was removed  which resulted in significant decompression of the left L2 nerve root.  At  this point it was felt that because of the patient's age and poor bone  quality, it would be best not to injure the disk space and subsequently, a  formal diskectomy at this level was not performed.  During dissection, a  small amount of arachnoid and the axilla of the L1 nerve root was identified  and this was very carefully protected.  No evidence of any CSF leak.  A  small piece of Gelfoam was placed overlying this.  80 mg of Depo-Medrol were  placed into the operative defect  after the hemostasis was assured and the  wound was irrigated.  Subsequently, the lumbodorsal fascia was closed with 0  Vicryl suture.  The subcutaneous tissues were reapproximated with 2-0 Vicryl  interrupted inverted sutures and the skin edges were reapproximated with  interrupted 3-0 Vicryl subcuticular stitch.  The wound was dressed with  DermaBond.  The patient was extubated in the operating room and taken to the  recovery room in stable and satisfactory condition having tolerated the  operation well.  Counts were correct at the end of the case.      Danae Orleans. Venetia Maxon, M.D.  Electronically Signed     JDS/MEDQ  D:  07/18/2005  T:  07/19/2005  Job:  [id number]

## 2011-04-12 NOTE — Op Note (Signed)
[name], [name]                           ACCOUNT NO.:  [id number]   MEDICAL RECORD NO.:  [id number]                   PATIENT TYPE:  OIB   LOCATION:  2009                                 FACILITY:  [facility]   PHYSICIAN:  Evelene Croon, M.D.                  DATE OF BIRTH:  06/09/1922   DATE OF PROCEDURE:  10/25/2002  DATE OF DISCHARGE:                                 OPERATIVE REPORT   PREOPERATIVE DIAGNOSIS:  Tachy-brady syndrome.   POSTOPERATIVE DIAGNOSIS:  Tachy-brady syndrome.   PROCEDURE:  Implantation of permanent dual chamber pacemaker.   SURGEON:  Evelene Croon, M.D.   ANESTHESIA:  Monitored anesthesia care.   CLINICAL HISTORY:  This patient is a  77 year old patient of Dr. Julieanne Manson, who has tachy-brady syndrome with documented severe bradycardic  episodes as well as some supraventricular tachycardia. It is felt that the  insertion of a permanent dual chamber pacemaker is the best  treatment to prevent the severe bradycardic episodes, which have been  associated with some shortness of breath. This will also allow  her to be  treated with beta blockers to prevent her tachycardia. I discussed the  operative procedure with the patient and her daughter, including  alternatives, benefits and risks, including bleeding, infection,  dislodgement and malfunction of the pacemaker requiring revision,  pneumothorax, and they understand and agreed to proceed.   DESCRIPTION OF PROCEDURE:  The patient was taken to the operating room and  placed in the supine position on the operating table. She was given  preoperative intravenous Zinacef. After intravenous sedation the neck and  chest were prepped with Betadine soap and solution and draped in the usual  sterile manner. Then the skin and subcutaneous tissue in the left  infraclavicular region was anesthetized with 1% lidocaine local anesthesia.   A transverse incision was made below the left clavicle and was carried down  through the subcutaneous tissues using electrocautery. A subcutaneous pocket  was developed just above the pectoralis fascia. There was complete  hemostasis.   Then  the left subclavian vein was cannulated with a needle and a guide wire  was advanced into the right  side of the heart under fluoroscopic guidance.  The vein was then cannulated again with a needle and another guide wire  advanced into the right side of the heart under fluoroscopic guidance.   Then over the first guide wire, a 9 French introducer and sheath  was  inserted. The wire and introducer were removed  and through the sheath a  Medtronic active fixation ventricular lead was inserted. This had model  #5076 and serial #[id number]. This was a simple 2 cm lead.   This was positioned in several locations in the right ventricle and we had  difficulty obtaining adequate pacing threshold.  We finally placed the lead  slightly up on  the intraventricular septum and achieved satisfactory  measurements. The R-wave was 7.1 millivolts. The impedance was 1077. The  threshold was 1.0 volts at 0.5 milliseconds. The current was 1.1 milliamps.  This lead was then fixed to the pectoralis fascia using a suture sleeve.   Then through the second non [place] introducer sheath, a Medtronic atrial  lead was inserted. This had model #5076 and serial #[id number]. The tip was  positioned in the right atrial appendage. This resulted in a P-wave of 2.3  millivolts. Impedance was 482. The threshold was 0.5 volts at 0.5  milliseconds and 1.2 milliamps. This lead was also fixed to the pectoralis  fascia using the suture sleeve.   Then both leads were connected to a Medtronic Kappa dual chamber generator,  model [id number], serial [id number].  The generator was placed in the  subcutaneous pocket. The patient was AV sequentially pacing at the rate of  80 at this time. There was complete hemostasis. The subcutaneous tissue was  closed with continuous  2-0 Vicryl and the skin with 3-0 Vicryl subcuticular  closure.   All sponge, instrument and needle counts were correct according to the scrub  nurse. Dry sterile dressings  were applied over the incision. The patient  tolerated the procedure well and was transported to the post anesthesia care  unit in satisfactory and stable condition.                                               Evelene Croon, M.D.    BB/MEDQ  D:  10/25/2002  T:  10/25/2002  Job:  [id number]   cc:   Thereasa Solo. Little, M.D.  [address]  [place] [address]  Fax: [phone]

## 2011-04-12 NOTE — Discharge Summary (Signed)
[name], [name]               ACCOUNT NO.:  [id number]   MEDICAL RECORD NO.:  [id number]          PATIENT TYPE:  INP   LOCATION:  3011                         FACILITY:  [facility]   PHYSICIAN:  Danae Orleans. Venetia Maxon, M.D.  DATE OF BIRTH:  1922-02-20   DATE OF ADMISSION:  07/18/2005  DATE OF DISCHARGE:  07/21/2005                                 DISCHARGE SUMMARY   REASON FOR ADMISSION:  Lumbar disc herniation with prior lumbar vertebral  fracture.   ADDITIONAL DIAGNOSES:  1.  Urinary retention, not specified.  2.  Lumbosacral degeneration and fracture.   HOSPITAL COURSE:  [name] is an 77 year old woman with low back and  left leg pain who had an L1-L2 disc herniation.  She underwent surgery with  removal of fragment of herniated disc material and did well postoperatively.  However, she fell and developed recurrence of low back pain and was found to  have an L2 fracture.  She underwent a vertebroplasty of the L2 fracture by  one of the radiologist, but did not have great relief in her back and leg  pain.  Repeat myelography demonstrated recurrent disc herniation at L1-L2  level and consequently, it was elected to take her back to surgery for redo  L1-L2 microdiscectomy.  This was performed without complications and  postoperatively, the patient was gradually mobilized.  She did have urinary  retention requiring catheter placement.  She gradually mobilized, had  significant relief in her pain, and was discharged home in stable,  satisfactory condition, having tolerated the operation and hospitalization  well with instructions to follow up in the office with Dr. Venetia Maxon in three  weeks.      Danae Orleans. Venetia Maxon, M.D.  Electronically Signed     JDS/MEDQ  D:  09/24/2005  T:  09/24/2005  Job:  [id number]

## 2011-04-12 NOTE — Op Note (Signed)
[name], [name]               ACCOUNT NO.:  [id number]   MEDICAL RECORD NO.:  [id number]          PATIENT TYPE:  INP   LOCATION:  2899                         FACILITY:  [facility]   PHYSICIAN:  Danae Orleans. Venetia Maxon, M.D.  DATE OF BIRTH:  [birth year]   DATE OF PROCEDURE:  02/19/2005  DATE OF DISCHARGE:                                 OPERATIVE REPORT   PREOPERATIVE DIAGNOSES:  Herniated lumbar disk with spondylosis with  degenerative disk disease and lumbar radiculopathy at L1-2, left.   POSTOPERATIVE DIAGNOSES:  Herniated lumbar disk with spondylosis with  degenerative disk disease and lumbar radiculopathy at L1-2, left.   PROCEDURES:  Left L1-2 microdiskectomy with microdissection.   SURGEON:  Danae Orleans. Venetia Maxon, M.D.   ASSISTANT:  Coletta Memos, M.D.   ANESTHESIA:  General endotracheal anesthesia.   ESTIMATED BLOOD LOSS:  400 mL.   COMPLICATIONS:  None.   DISPOSITION:  Recovery.   INDICATIONS:  [name] is an 77 year old woman with a very large disk  herniation at L1-2 on the left with severe pain.  It was elected to take her  to surgery for microdiskectomy at this effected level.   DESCRIPTION OF PROCEDURE:  [name] was brought to the operating room.  Following satisfactory and uncomplicated induction of general endotracheal  anesthesia and placement of intravenous lines, she was placed in the prone  position on the Wilson frame.  Her low back was then prepped and draped in  usual sterile fashion.  A spinal needle was placed.  It was felt to be the  L1-2 level and an x-ray was obtained.  This confirmed this to be the L1-2  level.  Subsequently skin and subcutaneous tissues were infiltrated with  0.25% Marcaine, 0.5% lidocaine and 1:200,000 epinephrine.  An incision was  made in the midline and carried from overlying the L1 and L2 spinous  processes.  On the left side of the midline, subperiosteal dissection was  performed, exposing the L1-2 interspace.  Intraoperative  x-ray was again  obtained, demonstrating that this was in fact the L1-2 level.  A  hemisemilaminectomy L1 was then performed with high-speed drill and  completed with Kerrison rongeur and a foraminotomy was performed overlying  the L2 nerve root.  A medial facetectomy was performed at the L1-2 level on  the left.  The ligamentum flavum, which was highly degenerated, was detached  and removed in a piecemeal fashion.  The spinal cord dura was identified and  using microdissection technique with the operative microscope, a disk  herniation along the lateral aspect of the spinal canal was identified.  This was quite degenerated.  Multiple large fragments of disk material were  removed.  Using microdissection technique with __________ hooks, the  remaining disk material was mobilized.  This was underlying the L1 nerve  root and the L2 nerve root and these were decompressed.  There was a fair  amount of epidural venous bleeding which was controlled with Gelfoam soaked  in thrombin.  Subsequently after hemostasis was assured, the epidural space  was infiltrated with Depo-Medrol and fentanyl.  The self-retaining retractor  was then removed.  Then the lumbodorsal fascia was closed with 0 Vicryl  sutures, the subcutaneous tissues were reapproximated with 2-0 Vicryl  interrupted inverted sutures and the skin edges were reapproximated with  interrupted 3-0 Vicryl subcuticular sutures.  The wound was dressed with  Dermabond.  The patient was extubated in the operating room and was taken to  the recovery room in satisfactory and stable condition having tolerated the  procedure well.  All counts were correct at the end of the case.      JDS/MEDQ  D:  02/19/2005  T:  02/19/2005  Job:  [id number]

## 2011-05-01 ENCOUNTER — Ambulatory Visit (HOSPITAL_COMMUNITY)
Admission: RE | Admit: 2011-05-01 | Discharge: 2011-05-01 | Disposition: A | Discharge status: Home / Self Care | Payer: Medicare Other | Source: Ambulatory Visit | Attending: Cardiovascular Disease | Admitting: Cardiovascular Disease

## 2011-05-01 DIAGNOSIS — Z45018 Encounter for adjustment and management of other part of cardiac pacemaker: Secondary | ICD-10-CM | POA: Insufficient documentation

## 2011-05-01 HISTORY — PX: CARDIAC PACEMAKER PLACEMENT: SHX583

## 2011-05-11 NOTE — Op Note (Signed)
  NAMEDESTINEY, [name]               ACCOUNT NO.:  [id number]  MEDICAL RECORD NO.:  [id number]  LOCATION:  [facility]                         FACILITY:  [facility]  PHYSICIAN:  Thurmon Fair, MD     DATE OF BIRTH:  1922/04/05  DATE OF PROCEDURE:  05/01/2011 DATE OF DISCHARGE:  05/01/2011                              OPERATIVE REPORT   PROCEDURES PERFORMED: 1. Moderate sedation. 2. Pacemaker generator change out.  REASONS FOR THE PROCEDURE: 1. Pacemaker generator at elective replacement interval. 2. Sinus node dysfunction and paroxysmal atrial fibrillation with     tachycardia-bradycardia syndrome.  PROCEDURE PERFORMED BY:  Thurmon Fair, MD  ASSISTANT:  Lauro Regulus.  MEDICATIONS ADMINISTERED:  Ancef 1 g intravenously, lidocaine 1% 25 mL locally, Versed 2 mg intravenously.  COMPLICATIONS:  None.  ESTIMATED BLOOD LOSS:  Less than 10 mL.  DEVICE DETAIL:  The explanted device is a Medtronic Kappa [id number], serial [id number].  The newly implanted generator is a Medtronic Adapta, model number ADDRL1, serial [id number].  The chronic atrial lead is a Medtronic 5076 - 45 cm, serial #[id number], we initially inserted on October 25, 2002.  The chronic ventricular lead is a Medtronic 5076 - 52 cm, serial #[id number] , we implanted on October 25, 2002.  During the procedure, the following lead measurements were encountered.  Atrial sensed P-wave 2.0 mV, impedance 507 ohms, threshold 0.5 volts at 0.4 milliseconds pulse width, current 1.0 mA.  Ventricular lead sensed R-waves 11.3 mV, impedance 418 ohms, threshold 0.8 volts at 0.4 milliseconds pulse width, current 2.1 mA.  After the risks and benefits of the procedure were described, the patient was brought in for consent, was brought to the cardiac cath lab in a fasting state.  Left prepectoral area was prepped and draped in the usual sterile fashion.  Local anesthesia 1% lidocaine was administered to the area of the previous pacemaker  implantation scar.  A 6-cm horizontal incision was made parallel with the lower border of the left clavicle.  Limited electrocautery and blunt dissection was used to carefully open the chronic pacemaker pocket with a great care being taken not to injure the chronic leads.  The pacemaker was explanted and the leads were disconnected from the old generator and individually tested.  The pocket was flushed with copious amounts of antibiotic solution and carefully inspected for hemostasis, it was found to be very good.  The new generator was connected to the chronic ventricular lead and appropriate ventricular pacing was seen.  The atrial lead was then connected and appropriate AV sequential function was noted.  The device was then placed back into the pocket with great care being taken that leads be located deep to the generator.  The pocket was closed in layers using two layers of 2-0 Vicryl and cutaneous staples after which a sterile dressing was applied.  No complications occurred.     Thurmon Fair, MD     MC/MEDQ  D:  05/01/2011  T:  05/02/2011  Job:  [id number]  cc:   [facility] Dr. Clarene Duke  Electronically Signed by Thurmon Fair M.D. on 05/11/2011 07:16:10 AM

## 2011-06-12 ENCOUNTER — Ambulatory Visit: Payer: Medicare Other | Admitting: Internal Medicine

## 2011-06-17 ENCOUNTER — Encounter: Payer: Self-pay | Admitting: Internal Medicine

## 2011-06-17 ENCOUNTER — Telehealth: Payer: Self-pay

## 2011-06-17 ENCOUNTER — Ambulatory Visit (INDEPENDENT_AMBULATORY_CARE_PROVIDER_SITE_OTHER): Payer: Medicare Other | Admitting: Internal Medicine

## 2011-06-17 VITALS — BP 136/82 | HR 71 | Temp 98.1°F | Resp 16 | Wt 125.0 lb

## 2011-06-17 DIAGNOSIS — I10 Essential (primary) hypertension: Secondary | ICD-10-CM

## 2011-06-17 DIAGNOSIS — Z1231 Encounter for screening mammogram for malignant neoplasm of breast: Secondary | ICD-10-CM | POA: Insufficient documentation

## 2011-06-17 NOTE — Telephone Encounter (Signed)
abstract

## 2011-06-17 NOTE — Patient Instructions (Signed)

## 2011-06-17 NOTE — Assessment & Plan Note (Signed)
Her BP is well controlled 

## 2011-06-17 NOTE — Progress Notes (Signed)
Subjective:    Patient ID: [name], female    DOB: Aug 13, 1922, 77 y.o.   MRN: [id number]  Hypertension This is a chronic problem. The current episode started more than 1 year ago. The problem has been gradually improving since onset. The problem is controlled. Pertinent negatives include no anxiety, blurred vision, chest pain, headaches, malaise/fatigue, neck pain, orthopnea, palpitations, peripheral edema, PND, shortness of breath or sweats. Past treatments include beta blockers, angiotensin blockers and calcium channel blockers. The current treatment provides significant improvement. There are no compliance problems.       Review of Systems  Constitutional: Negative for fever, chills, malaise/fatigue, diaphoresis, activity change, appetite change, fatigue and unexpected weight change.  HENT: Negative for sore throat, facial swelling, trouble swallowing, neck pain and voice change.   Eyes: Negative for blurred vision, photophobia, pain, discharge, redness, itching and visual disturbance.  Respiratory: Negative for apnea, cough, choking, chest tightness, shortness of breath, wheezing and stridor.   Cardiovascular: Negative for chest pain, palpitations, orthopnea, leg swelling and PND.  Gastrointestinal: Negative for nausea, vomiting, abdominal pain, diarrhea and constipation.  Genitourinary: Negative for dysuria, urgency, frequency, hematuria, flank pain, enuresis, difficulty urinating and dyspareunia.  Musculoskeletal: Negative for myalgias, back pain, joint swelling, arthralgias and gait problem.  Neurological: Negative for dizziness, tremors, seizures, syncope, facial asymmetry, speech difficulty, weakness, light-headedness, numbness and headaches.  Hematological: Negative.   Psychiatric/Behavioral: Negative.        Objective:   Physical Exam  Vitals reviewed. Constitutional: She is oriented to person, place, and time. She appears well-developed and well-nourished. No distress.    HENT:  Head: Normocephalic and atraumatic.  Right Ear: External ear normal.  Left Ear: External ear normal.  Nose: Nose normal.  Mouth/Throat: Oropharynx is clear and moist. No oropharyngeal exudate.  Eyes: Conjunctivae and EOM are normal. Pupils are equal, round, and reactive to light. Right eye exhibits no discharge. Left eye exhibits no discharge. No scleral icterus.  Neck: Normal range of motion. Neck supple. No JVD present. No tracheal deviation present. No thyromegaly present.  Cardiovascular: Normal rate, regular rhythm, normal heart sounds and intact distal pulses.  Exam reveals no gallop and no friction rub.   No murmur heard. Pulmonary/Chest: Effort normal and breath sounds normal. No stridor. No respiratory distress. She has no wheezes. She has no rales. She exhibits no tenderness.  Abdominal: Soft. Bowel sounds are normal. She exhibits no distension and no mass. There is no tenderness. There is no rebound and no guarding.  Musculoskeletal: Normal range of motion. She exhibits no edema and no tenderness.  Lymphadenopathy:    She has no cervical adenopathy.  Neurological: She is alert and oriented to person, place, and time. She has normal reflexes. She displays normal reflexes. No cranial nerve deficit. She exhibits normal muscle tone. Coordination normal.  Skin: Skin is warm and dry. No rash noted. She is not diaphoretic. No erythema. No pallor.  Psychiatric: She has a normal mood and affect. Her behavior is normal. Judgment and thought content normal.        Lab Results  Component Value Date   WBC 7.9 10/31/2010   HGB 13.3 10/31/2010   HCT 37.2 10/31/2010   PLT 172.0 10/31/2010   ALT 15 10/31/2010   AST 27 10/31/2010   NA 137 10/31/2010   K 4.5 10/31/2010   CL 99 10/31/2010   CREATININE 0.7 10/31/2010   BUN 22 10/31/2010   CO2 31 10/31/2010   TSH 1.70 10/31/2010   INR 1.05  07/18/2010   HGBA1C  Value: 5.3 (NOTE) The ADA recommends the following therapeutic goal for glycemic  control related to Hgb A1c measurement: Goal of therapy: <6.5 Hgb A1c  Reference: American Diabetes Association: Clinical Practice Recommendations 2010, Diabetes Care, 2010, 33: (Suppl  1). 11/27/2009    Assessment & Plan:

## 2011-08-15 LAB — CBC
HCT: 37.4
Hemoglobin: 12.9
MCV: 95.1
Platelets: 150
RDW: 12.9

## 2011-08-15 LAB — BASIC METABOLIC PANEL
BUN: 13
CO2: 25
Chloride: 93 — ABNORMAL LOW
GFR calc non Af Amer: >60
Glucose, Bld: 96
Potassium: 3.8
Sodium: 128 — ABNORMAL LOW

## 2011-08-20 LAB — COMPREHENSIVE METABOLIC PANEL
ALT: 22
AST: 38 — ABNORMAL HIGH
Albumin: 3.7
Alkaline Phosphatase: 41
CO2: 28
Chloride: 92 — ABNORMAL LOW
GFR calc Af Amer: >60
GFR calc non Af Amer: >60
Potassium: 3.7
Total Bilirubin: 0.7

## 2011-08-20 LAB — CBC
HCT: 30.6 — ABNORMAL LOW
HCT: 31.4 — ABNORMAL LOW
HCT: 35.8 — ABNORMAL LOW
Hemoglobin: 10.5 — ABNORMAL LOW
Hemoglobin: 12.2
MCHC: 33.8
MCHC: 34.3
MCV: 90.1
MCV: 90.3
Platelets: 136 — ABNORMAL LOW
RBC: 3.42 — ABNORMAL LOW
RDW: 14.4
RDW: 14.7
WBC: 5.6
WBC: 7.5

## 2011-08-20 LAB — BASIC METABOLIC PANEL
BUN: 17
CO2: 25
Calcium: 8.3 — ABNORMAL LOW
Chloride: 97
Creatinine, Ser: 0.64
Glucose, Bld: 96

## 2011-08-20 LAB — HEMOGLOBIN A1C: Mean Plasma Glucose: 140

## 2011-08-20 LAB — LIPID PANEL
Cholesterol: 140
LDL Cholesterol: 80
Total CHOL/HDL Ratio: 2.9

## 2011-08-20 LAB — POCT CARDIAC MARKERS
Operator id: 133351
Troponin i, poc: <0.05

## 2011-08-20 LAB — TSH: TSH: 2.998

## 2011-08-20 LAB — HEPARIN LEVEL (UNFRACTIONATED)
Heparin Unfractionated: 0.66
Heparin Unfractionated: 0.69

## 2011-08-20 LAB — CARDIAC PANEL(CRET KIN+CKTOT+MB+TROPI)
CK, MB: 3.6
Relative Index: 3 — ABNORMAL HIGH
Relative Index: INVALID
Total CK: 120
Troponin I: 0.01

## 2011-08-20 LAB — DIFFERENTIAL
Basophils Absolute: 0.1
Basophils Relative: 1
Eosinophils Absolute: 0.1
Eosinophils Relative: 2
Monocytes Absolute: 0.6

## 2011-08-20 LAB — APTT: aPTT: 26

## 2011-08-20 LAB — POCT I-STAT, CHEM 8
Calcium, Ion: 1.05 — ABNORMAL LOW
Chloride: 93 — ABNORMAL LOW
Glucose, Bld: 116 — ABNORMAL HIGH
HCT: 36
TCO2: 26

## 2011-08-20 LAB — CK TOTAL AND CKMB (NOT AT ARMC): Relative Index: 3.4 — ABNORMAL HIGH

## 2011-08-20 LAB — PROTIME-INR
INR: 1
INR: 1.1

## 2011-08-20 LAB — TROPONIN I: Troponin I: 0.01

## 2011-09-05 LAB — CBC
HCT: 39
Hemoglobin: 13.4
MCHC: 34.4
Platelets: 161
RDW: 13.5

## 2011-09-05 LAB — DIFFERENTIAL
Basophils Absolute: 0
Basophils Relative: 0
Eosinophils Relative: 1
Monocytes Absolute: 0.6

## 2011-09-05 LAB — PROTIME-INR: Prothrombin Time: 13.1

## 2012-01-14 ENCOUNTER — Other Ambulatory Visit: Payer: Self-pay | Admitting: Internal Medicine

## 2012-01-18 ENCOUNTER — Other Ambulatory Visit: Payer: Self-pay | Admitting: Internal Medicine

## 2012-02-11 ENCOUNTER — Other Ambulatory Visit: Payer: Self-pay | Admitting: Internal Medicine

## 2012-03-17 ENCOUNTER — Other Ambulatory Visit: Payer: Self-pay | Admitting: Internal Medicine

## 2012-04-15 ENCOUNTER — Other Ambulatory Visit: Payer: Self-pay | Admitting: Internal Medicine

## 2012-05-15 ENCOUNTER — Other Ambulatory Visit: Payer: Self-pay | Admitting: Internal Medicine

## 2012-06-16 ENCOUNTER — Other Ambulatory Visit: Payer: Self-pay | Admitting: Internal Medicine

## 2012-06-18 ENCOUNTER — Emergency Department (HOSPITAL_COMMUNITY): Payer: Medicare Other

## 2012-06-18 ENCOUNTER — Encounter (HOSPITAL_COMMUNITY): Payer: Self-pay

## 2012-06-18 ENCOUNTER — Inpatient Hospital Stay (HOSPITAL_COMMUNITY): Payer: Medicare Other

## 2012-06-18 ENCOUNTER — Inpatient Hospital Stay (HOSPITAL_COMMUNITY)
Admission: EM | Admit: 2012-06-18 | Discharge: 2012-06-23 | DRG: 312 | Disposition: A | Discharge status: Skilled Nursing Facility | Payer: Medicare Other | Attending: Internal Medicine | Admitting: Internal Medicine

## 2012-06-18 DIAGNOSIS — R413 Other amnesia: Secondary | ICD-10-CM | POA: Diagnosis present

## 2012-06-18 DIAGNOSIS — F0390 Unspecified dementia without behavioral disturbance: Secondary | ICD-10-CM | POA: Diagnosis present

## 2012-06-18 DIAGNOSIS — Z7982 Long term (current) use of aspirin: Secondary | ICD-10-CM

## 2012-06-18 DIAGNOSIS — I10 Essential (primary) hypertension: Secondary | ICD-10-CM | POA: Diagnosis present

## 2012-06-18 DIAGNOSIS — R339 Retention of urine, unspecified: Secondary | ICD-10-CM | POA: Diagnosis not present

## 2012-06-18 DIAGNOSIS — N390 Urinary tract infection, site not specified: Secondary | ICD-10-CM | POA: Diagnosis present

## 2012-06-18 DIAGNOSIS — Z950 Presence of cardiac pacemaker: Secondary | ICD-10-CM

## 2012-06-18 DIAGNOSIS — I495 Sick sinus syndrome: Secondary | ICD-10-CM | POA: Diagnosis present

## 2012-06-18 DIAGNOSIS — D649 Anemia, unspecified: Secondary | ICD-10-CM

## 2012-06-18 DIAGNOSIS — I2510 Atherosclerotic heart disease of native coronary artery without angina pectoris: Secondary | ICD-10-CM | POA: Diagnosis present

## 2012-06-18 DIAGNOSIS — M810 Age-related osteoporosis without current pathological fracture: Secondary | ICD-10-CM | POA: Diagnosis present

## 2012-06-18 DIAGNOSIS — R55 Syncope and collapse: Principal | ICD-10-CM | POA: Diagnosis present

## 2012-06-18 DIAGNOSIS — E785 Hyperlipidemia, unspecified: Secondary | ICD-10-CM | POA: Diagnosis present

## 2012-06-18 DIAGNOSIS — Z87891 Personal history of nicotine dependence: Secondary | ICD-10-CM

## 2012-06-18 DIAGNOSIS — S329XXA Fracture of unspecified parts of lumbosacral spine and pelvis, initial encounter for closed fracture: Secondary | ICD-10-CM | POA: Diagnosis present

## 2012-06-18 HISTORY — DX: Presence of cardiac pacemaker: Z95.0

## 2012-06-18 LAB — CBC WITH DIFFERENTIAL/PLATELET
Basophils Absolute: 0 10*3/uL (ref 0.0–0.1)
Eosinophils Absolute: 0.1 10*3/uL (ref 0.0–0.7)
Eosinophils Relative: 0 % (ref 0–5)
Lymphocytes Relative: 10 % — ABNORMAL LOW (ref 12–46)
MCV: 92.4 fL (ref 78.0–100.0)
Neutrophils Relative %: 84 % — ABNORMAL HIGH (ref 43–77)
Platelets: 153 10*3/uL (ref 150–400)
RDW: 13.7 % (ref 11.5–15.5)
WBC: 15.5 10*3/uL — ABNORMAL HIGH (ref 4.0–10.5)

## 2012-06-18 LAB — BASIC METABOLIC PANEL
CO2: 31 mEq/L (ref 19–32)
Calcium: 9.8 mg/dL (ref 8.4–10.5)
GFR calc non Af Amer: 57 mL/min — ABNORMAL LOW (ref >90)
Sodium: 140 mEq/L (ref 135–145)

## 2012-06-18 LAB — URINALYSIS, ROUTINE W REFLEX MICROSCOPIC
Nitrite: NEGATIVE
Protein, ur: NEGATIVE mg/dL
Urobilinogen, UA: 0.2 mg/dL (ref 0.0–1.0)

## 2012-06-18 LAB — URINE MICROSCOPIC-ADD ON

## 2012-06-18 LAB — CARDIAC PANEL(CRET KIN+CKTOT+MB+TROPI)
CK, MB: 7.9 ng/mL (ref 0.3–4.0)
Total CK: 291 U/L — ABNORMAL HIGH (ref 7–177)

## 2012-06-18 LAB — POCT I-STAT TROPONIN I: Troponin i, poc: 0.02 ng/mL (ref 0.00–0.08)

## 2012-06-18 MED ORDER — CELECOXIB 200 MG PO CAPS
200.0000 mg | ORAL_CAPSULE | Freq: Every day | ORAL | Status: DC
  Administered 2012-06-19: 200 mg via ORAL
  Filled 2012-06-18 (×2): qty 1

## 2012-06-18 MED ORDER — VITAMIN D3 1.25 MG (50000 UT) PO CAPS
1.0000 | ORAL_CAPSULE | ORAL | Status: DC
Start: 2012-06-18 — End: 2012-06-18

## 2012-06-18 MED ORDER — FESOTERODINE FUMARATE ER 4 MG PO TB24
4.0000 mg | ORAL_TABLET | Freq: Every day | ORAL | Status: DC
  Administered 2012-06-19 – 2012-06-23 (×5): 4 mg via ORAL
  Filled 2012-06-18 (×6): qty 1

## 2012-06-18 MED ORDER — BISACODYL 10 MG RE SUPP: 10.0000 mg | Freq: Every day | RECTAL | Status: DC | PRN

## 2012-06-18 MED ORDER — ALUM & MAG HYDROXIDE-SIMETH 200-200-20 MG/5ML PO SUSP
30.0000 mL | Freq: Four times a day (QID) | ORAL | Status: DC | PRN
  Administered 2012-06-21: 30 mL via ORAL
  Filled 2012-06-18: qty 30

## 2012-06-18 MED ORDER — SODIUM CHLORIDE 0.9 % IJ SOLN
3.0000 mL | Freq: Two times a day (BID) | INTRAMUSCULAR | Status: DC
  Administered 2012-06-19 – 2012-06-22 (×7): 3 mL via INTRAVENOUS

## 2012-06-18 MED ORDER — TIMOLOL MALEATE 0.25 % OP SOLN
1.0000 [drp] | Freq: Every day | OPHTHALMIC | Status: DC
  Administered 2012-06-18: 1 [drp] via OPHTHALMIC
  Filled 2012-06-18: qty 5

## 2012-06-18 MED ORDER — DOCUSATE SODIUM 100 MG PO CAPS
100.0000 mg | ORAL_CAPSULE | Freq: Two times a day (BID) | ORAL | Status: DC
  Administered 2012-06-18 – 2012-06-23 (×10): 100 mg via ORAL
  Filled 2012-06-18 (×10): qty 1

## 2012-06-18 MED ORDER — ONDANSETRON HCL 4 MG/2ML IJ SOLN: 4.0000 mg | Freq: Four times a day (QID) | INTRAMUSCULAR | Status: DC | PRN

## 2012-06-18 MED ORDER — MIRABEGRON ER 25 MG PO TB24
25.0000 mg | ORAL_TABLET | Freq: Every day | ORAL | Status: DC
  Administered 2012-06-18 – 2012-06-22 (×5): 25 mg via ORAL
  Filled 2012-06-18 (×6): qty 1

## 2012-06-18 MED ORDER — VITAMIN D (ERGOCALCIFEROL) 1.25 MG (50000 UNIT) PO CAPS: 50000.0000 [IU] | ORAL_CAPSULE | ORAL | Status: DC

## 2012-06-18 MED ORDER — MORPHINE SULFATE 4 MG/ML IJ SOLN
4.0000 mg | Freq: Once | INTRAMUSCULAR | Status: AC
  Administered 2012-06-18: 4 mg via INTRAVENOUS
  Filled 2012-06-18: qty 1

## 2012-06-18 MED ORDER — HYDROCODONE-ACETAMINOPHEN 5-325 MG PO TABS
1.0000 | ORAL_TABLET | ORAL | Status: DC | PRN
  Administered 2012-06-19 – 2012-06-20 (×4): 1 via ORAL
  Administered 2012-06-20 – 2012-06-22 (×4): 2 via ORAL
  Filled 2012-06-18: qty 2
  Filled 2012-06-18 (×2): qty 1
  Filled 2012-06-18: qty 2
  Filled 2012-06-18: qty 1
  Filled 2012-06-18 (×2): qty 2
  Filled 2012-06-18: qty 1

## 2012-06-18 MED ORDER — DONEPEZIL HCL 5 MG PO TABS
5.0000 mg | ORAL_TABLET | Freq: Every day | ORAL | Status: DC
  Administered 2012-06-18 – 2012-06-22 (×5): 5 mg via ORAL
  Filled 2012-06-18 (×6): qty 1

## 2012-06-18 MED ORDER — SODIUM CHLORIDE 0.9 % IV SOLN
INTRAVENOUS | Status: AC
  Administered 2012-06-18 – 2012-06-19 (×2): 50 mL/h via INTRAVENOUS

## 2012-06-18 MED ORDER — CLOPIDOGREL BISULFATE 75 MG PO TABS
75.0000 mg | ORAL_TABLET | Freq: Every day | ORAL | Status: DC
  Administered 2012-06-19 – 2012-06-20 (×2): 75 mg via ORAL
  Filled 2012-06-18 (×3): qty 1

## 2012-06-18 MED ORDER — ONDANSETRON HCL 4 MG PO TABS: 4.0000 mg | ORAL_TABLET | Freq: Four times a day (QID) | ORAL | Status: DC | PRN

## 2012-06-18 MED ORDER — ONDANSETRON HCL 4 MG/2ML IJ SOLN
4.0000 mg | INTRAMUSCULAR | Status: AC
  Administered 2012-06-18: 4 mg via INTRAVENOUS
  Filled 2012-06-18: qty 2

## 2012-06-18 MED ORDER — HEPARIN SODIUM (PORCINE) 5000 UNIT/ML IJ SOLN
5000.0000 [IU] | Freq: Three times a day (TID) | INTRAMUSCULAR | Status: DC
  Administered 2012-06-18 – 2012-06-19 (×3): 5000 [IU] via SUBCUTANEOUS
  Filled 2012-06-18 (×6): qty 1

## 2012-06-18 NOTE — Consult Note (Signed)
Reason for Consult: Syncope  Referring Physician:    Avanthika Dehnert is an 78 y.o. female.  HPI:   The patient is a 78 yo female with a history of CAD with stent to the LAD in 2008.  Her last NST was in March 2012 and was negative for ischemia and normal EF.  She has a [employer] Adapta PPM which was implanted on May 01, 2011 for tachybrady syndrome.  She was last seen on July 17 and the pacer interrogation revealed  A-pacing 67% with 6 episode of mode switching all of which were less than a minute.  Her history also includes HTN, OSA(CPAP), HLD.  Patient states that she gets a posterior bathroom numerous times in the middle of the night. In the medication she was unaware the bathroom and tripped over a folding table that was in the room. She apparently hit her head. She states she took herself called into the kitchen where she was found by her grandson. Does have pain in the right orbit region and left side of her face.  She denies dizziness, chest pain, shortness of breath, nausea, vomiting, palpitations, abdominal pain, Lotrimin edema.   Past Medical History  Diagnosis Date  . CAD (coronary artery disease)   . Headache   . Hyperlipidemia   . Hypertension   . Osteoporosis     Past Surgical History  Procedure Date  . Total hip arthroplasty   . Orif hip fracture   . Lumbar laminectomy   . Lumbar fusion   . Cardiac pacemaker placement   . Coronary angioplasty with stent placement   . Tonsillectomy     Family History  Problem Relation Age of Onset  . Arthritis Other   . Hypertension Other     Social History:  reports that she has quit smoking. She does not have any smokeless tobacco history on file. She reports that she does not drink alcohol or use illicit drugs.  Allergies:  Allergies  Allergen Reactions  . Atropine   . Barbiturates   . Nembutal (Pentobarbital Sodium)     Medications:     . celecoxib  200 mg Oral Daily  . clopidogrel  75 mg Oral Daily  . docusate sodium   100 mg Oral BID  . donepezil  5 mg Oral QHS  . fesoterodine  4 mg Oral Daily  . heparin  5,000 Units Subcutaneous Q8H  . mirabegron ER  25 mg Oral QHS  . morphine  4 mg Intravenous Once  . ondansetron (ZOFRAN) IV  4 mg Intravenous STAT  . sodium chloride  3 mL Intravenous Q12H  . timolol  1 drop Both Eyes Daily  . Vitamin D3  1 capsule Oral Q30 days     Results for orders placed during the hospital encounter of 06/18/12 (from the past 48 hour(s))  CBC WITH DIFFERENTIAL     Status: Abnormal   Collection Time   06/18/12  9:37 AM      Component Value Range Comment   WBC 15.5 (*) 4.0 - 10.5 K/uL    RBC 3.97  3.87 - 5.11 MIL/uL    Hemoglobin 12.5  12.0 - 15.0 g/dL    HCT 09.8  11.9 - 14.7 %    MCV 92.4  78.0 - 100.0 fL    MCH 31.5  26.0 - 34.0 pg    MCHC 34.1  30.0 - 36.0 g/dL    RDW 82.9  56.2 - 13.0 %    Platelets 153  150 -  400 K/uL    Neutrophils Relative 84 (*) 43 - 77 %    Neutro Abs 13.0 (*) 1.7 - 7.7 K/uL    Lymphocytes Relative 10 (*) 12 - 46 %    Lymphs Abs 1.6  0.7 - 4.0 K/uL    Monocytes Relative 6  3 - 12 %    Monocytes Absolute 0.9  0.1 - 1.0 K/uL    Eosinophils Relative 0  0 - 5 %    Eosinophils Absolute 0.1  0.0 - 0.7 K/uL    Basophils Relative 0  0 - 1 %    Basophils Absolute 0.0  0.0 - 0.1 K/uL   BASIC METABOLIC PANEL     Status: Abnormal   Collection Time   06/18/12  9:37 AM      Component Value Range Comment   Sodium 140  135 - 145 mEq/L    Potassium 3.5  3.5 - 5.1 mEq/L    Chloride 100  96 - 112 mEq/L    CO2 31  19 - 32 mEq/L    Glucose, Bld 167 (*) 70 - 99 mg/dL    BUN 21  6 - 23 mg/dL    Creatinine, Ser 1.61  0.50 - 1.10 mg/dL    Calcium 9.8  8.4 - 09.6 mg/dL    GFR calc non Af Amer 57 (*) >90 mL/min    GFR calc Af Amer 67 (*) >90 mL/min   URINALYSIS, ROUTINE W REFLEX MICROSCOPIC     Status: Abnormal   Collection Time   06/18/12  9:47 AM      Component Value Range Comment   Color, Urine YELLOW  YELLOW    APPearance HAZY (*) CLEAR    Specific  Gravity, Urine 1.011  1.005 - 1.030    pH 7.5  5.0 - 8.0    Glucose, UA NEGATIVE  NEGATIVE mg/dL    Hgb urine dipstick LARGE (*) NEGATIVE    Bilirubin Urine NEGATIVE  NEGATIVE    Ketones, ur NEGATIVE  NEGATIVE mg/dL    Protein, ur NEGATIVE  NEGATIVE mg/dL    Urobilinogen, UA 0.2  0.0 - 1.0 mg/dL    Nitrite NEGATIVE  NEGATIVE    Leukocytes, UA SMALL (*) NEGATIVE   URINE MICROSCOPIC-ADD ON     Status: Abnormal   Collection Time   06/18/12  9:47 AM      Component Value Range Comment   Squamous Epithelial / LPF FEW (*) RARE    WBC, UA 3-6  <3 WBC/hpf    RBC / HPF 11-20  <3 RBC/hpf    Bacteria, UA FEW (*) RARE    Casts GRANULAR CAST (*) NEGATIVE    Urine-Other MUCOUS PRESENT     CARDIAC PANEL(CRET KIN+CKTOT+MB+TROPI)     Status: Abnormal   Collection Time   06/18/12  9:50 AM      Component Value Range Comment   Total CK 291 (*) 7 - 177 U/L    CK, MB 7.9 (*) 0.3 - 4.0 ng/mL    Troponin I <0.30  <0.30 ng/mL    Relative Index 2.7 (*) 0.0 - 2.5   POCT I-STAT TROPONIN I     Status: Normal   Collection Time   06/18/12  9:57 AM      Component Value Range Comment   Troponin i, poc 0.00  0.00 - 0.08 ng/mL    Comment 3              Dg Chest 1 View  06/18/2012  *RADIOLOGY REPORT*  Clinical Data: Hip/pelvic fracture, preoperative assessment  CHEST - 1 VIEW  Comparison: 01/21/2011  Findings: Left subclavian sequential transvenous pacemaker leads project at right atrium and right ventricle. Enlargement of cardiac silhouette. Calcified tortuous aorta. Pulmonary vascularity normal. Lungs clear. Question calcified granulomata projecting over right lung. No pleural effusion or pneumothorax. Compression fracture mid thoracic vertebra with evidence of prior spinal augmentation procedure.  IMPRESSION: Enlargement of cardiac silhouette post pacemaker. No acute abnormalities.  Original Report Authenticated By: Lollie Marrow, M.D.   Dg Hip Complete Right  06/18/2012  *RADIOLOGY REPORT*  Clinical Data: Fall.   Right hip pain.  RIGHT HIP - COMPLETE 2+ VIEW  Comparison: 06/05/2010.  Findings: There are new right parasymphyseal pubic fractures which appear acute, with sharply marginated edges.  Probable complimentary fracture involving the mid right inferior pubic ramus.  The sacral arcades appear intact.  The left obturator ring is intact. Iliac artery atherosclerosis.  Right total hip arthroplasty is present with unchanged displacement of the lesser trochanter from old fracture.  IMPRESSION: Acute nondisplaced right obturator ring fractures.  No contralateral obturator ring or sacral fracture identified.  Original Report Authenticated By: Andreas Newport, M.D.   Ct Head Wo Contrast  06/18/2012  *RADIOLOGY REPORT*  Clinical Data:  Found on floor.  Unwitnessed fall.  Found down. Question loss of consciousness.  Right periorbital hematoma. Headache.  CT HEAD WITHOUT CONTRAST CT CERVICAL SPINE WITHOUT CONTRAST  Technique:  Multidetector CT imaging of the head and cervical spine was performed following the standard protocol without intravenous contrast.  Multiplanar CT image reconstructions of the cervical spine were also generated.  Comparison:  CT head 06/18/2012.  CT HEAD  Findings: Multiple areas of face and scalp soft tissue swelling are present.  There is a soft tissue hematoma lateral to the right orbital rim measuring 21 mm x 13 mm (image 6 series 3).  Right ocular banding.  Bilateral lens extractions noted. There is no skull fracture.  Left parietal scalp soft tissue stranding compatible with contusion.  Stranding is present in the soft tissues overlying the left zygomatic arch, also compatible with contusion.  No mass lesion, mass effect, midline shift, hydrocephalus, hemorrhage.  No acute territorial cortical ischemia/infarct. Atrophy and chronic ischemic white matter disease is present. Left parietal and posterior temporal encephalomalacia are unchanged compared to prior, better seen on previous study.   IMPRESSION:  1.  No acute intracranial abnormality. 2.  Right periorbital subcutaneous hematoma. Marked swelling over the left zygomatic arch and into the left cheek extending to the left parotid is presumably post-traumatic and compatible with contusion. 3.  Small left parietal scalp contusion.  CT ORBITS WITHOUT CONTRAST  Technique:  Multidetector CT imaging of the orbits was performed following the standard protocol without intravenous contrast.  Findings:  There is no orbital fracture.  Marker overlies the right side of face.  Orbital floor and medial orbital wall appears intact.  Ocular changes as described above.  Right periorbital subcutaneous hematoma.  No retro bulb are or intraconal hematoma. Globes appear intact.  Marked soft tissue swelling is present in the left cheek extending to the left parotid, partially visualized. Presumably this is post-traumatic.  IMPRESSION:  Negative for orbital fracture.  CT CERVICAL SPINE  Findings: Reversal of the normal cervical lordosis is present. Apex of the reversal is at C5-C6.  There is no cervical spine fracture or dislocation.  Severe facet arthrosis and degenerative disc disease is present most pronounced at C4-C5 through C5-C6.  Craniocervical alignment is within normal limits.  Odontoid is intact.  Mastoid air cells appear within normal limits.  The degenerative changes with mild thickening of the transverse ligament of the atlas and calcification.  Carotid atherosclerosis. 3 mm anterolisthesis of C4 ounce C5 appears degenerative, associated with facet arthrosis.  Mild to moderate bony central stenosis at C4-C5 and C5-C6.  Multilevel foraminal stenosis, generally worse on the left.  IMPRESSION: No acute abnormality.  No cervical spine fracture or dislocation. Moderate to severe multilevel cervical spondylosis.  Original Report Authenticated By: Andreas Newport, M.D.   Ct Cervical Spine Wo Contrast  06/18/2012  *RADIOLOGY REPORT*  Clinical Data:  Found on floor.   Unwitnessed fall.  Found down. Question loss of consciousness.  Right periorbital hematoma. Headache.  CT HEAD WITHOUT CONTRAST CT CERVICAL SPINE WITHOUT CONTRAST  Technique:  Multidetector CT imaging of the head and cervical spine was performed following the standard protocol without intravenous contrast.  Multiplanar CT image reconstructions of the cervical spine were also generated.  Comparison:  CT head 06/18/2012.  CT HEAD  Findings: Multiple areas of face and scalp soft tissue swelling are present.  There is a soft tissue hematoma lateral to the right orbital rim measuring 21 mm x 13 mm (image 6 series 3).  Right ocular banding.  Bilateral lens extractions noted. There is no skull fracture.  Left parietal scalp soft tissue stranding compatible with contusion.  Stranding is present in the soft tissues overlying the left zygomatic arch, also compatible with contusion.  No mass lesion, mass effect, midline shift, hydrocephalus, hemorrhage.  No acute territorial cortical ischemia/infarct. Atrophy and chronic ischemic white matter disease is present. Left parietal and posterior temporal encephalomalacia are unchanged compared to prior, better seen on previous study.  IMPRESSION:  1.  No acute intracranial abnormality. 2.  Right periorbital subcutaneous hematoma. Marked swelling over the left zygomatic arch and into the left cheek extending to the left parotid is presumably post-traumatic and compatible with contusion. 3.  Small left parietal scalp contusion.  CT ORBITS WITHOUT CONTRAST  Technique:  Multidetector CT imaging of the orbits was performed following the standard protocol without intravenous contrast.  Findings:  There is no orbital fracture.  Marker overlies the right side of face.  Orbital floor and medial orbital wall appears intact.  Ocular changes as described above.  Right periorbital subcutaneous hematoma.  No retro bulb are or intraconal hematoma. Globes appear intact.  Marked soft tissue  swelling is present in the left cheek extending to the left parotid, partially visualized. Presumably this is post-traumatic.  IMPRESSION:  Negative for orbital fracture.  CT CERVICAL SPINE  Findings: Reversal of the normal cervical lordosis is present. Apex of the reversal is at C5-C6.  There is no cervical spine fracture or dislocation.  Severe facet arthrosis and degenerative disc disease is present most pronounced at C4-C5 through C5-C6. Craniocervical alignment is within normal limits.  Odontoid is intact.  Mastoid air cells appear within normal limits.  The degenerative changes with mild thickening of the transverse ligament of the atlas and calcification.  Carotid atherosclerosis. 3 mm anterolisthesis of C4 ounce C5 appears degenerative, associated with facet arthrosis.  Mild to moderate bony central stenosis at C4-C5 and C5-C6.  Multilevel foraminal stenosis, generally worse on the left.  IMPRESSION: No acute abnormality.  No cervical spine fracture or dislocation. Moderate to severe multilevel cervical spondylosis.  Original Report Authenticated By: Andreas Newport, M.D.   Ct Maxillofacial Wo Cm  06/18/2012  *RADIOLOGY REPORT*  Clinical Data: Fall, left sided face bruising, right eye swollen shut.  CT MAXILLOFACIAL WITHOUT CONTRAST  Technique:  Multidetector CT imaging of the maxillofacial structures was performed. Multiplanar CT image reconstructions were also generated.  Comparison: Head CT same day  Findings: There is a small hematoma lateral to the right orbit and superior to the ygomatic arch (image 65, series 3).  There is a large hematoma within the left muscles of mastication lateral the angled jaw measuring 2.5 cm in thickness compared to the normal right side which measures 1.3 cm in thickness.  There is subcutaneus stranding and edema in the soft tissues of the left face adjacent to the mandible.  There is some stranding in the parapharyngeal space on the left additionally.  No evidence of  orbital fracture.  The zygoma are intact.  No evidence of maxillary fracture or pterygoid plate fracture.  No fluid in the paranasal sinuses are frontal sinus.  Nasal bones are intact.  The mandibular condyles are located.  No evidence of mandible fracture.  IMPRESSION:  1.  A large hematoma within the left muscles of mastication. 2.  Extensive subcutaneous edema and likely hemorrhage within the soft tissues of the left face adjacent to of the angle jaw extending into the parapharyngeal space. 3.  No evidence of facial fracture. 4.  Smaller left hematoma lateral to the right lateral orbit.  Original Report Authenticated By: Genevive Bi, M.D.   Ct Orbitss W/o Cm  06/18/2012  *RADIOLOGY REPORT*  Clinical Data:  Found on floor.  Unwitnessed fall.  Found down. Question loss of consciousness.  Right periorbital hematoma. Headache.  CT HEAD WITHOUT CONTRAST CT CERVICAL SPINE WITHOUT CONTRAST  Technique:  Multidetector CT imaging of the head and cervical spine was performed following the standard protocol without intravenous contrast.  Multiplanar CT image reconstructions of the cervical spine were also generated.  Comparison:  CT head 06/18/2012.  CT HEAD  Findings: Multiple areas of face and scalp soft tissue swelling are present.  There is a soft tissue hematoma lateral to the right orbital rim measuring 21 mm x 13 mm (image 6 series 3).  Right ocular banding.  Bilateral lens extractions noted. There is no skull fracture.  Left parietal scalp soft tissue stranding compatible with contusion.  Stranding is present in the soft tissues overlying the left zygomatic arch, also compatible with contusion.  No mass lesion, mass effect, midline shift, hydrocephalus, hemorrhage.  No acute territorial cortical ischemia/infarct. Atrophy and chronic ischemic white matter disease is present. Left parietal and posterior temporal encephalomalacia are unchanged compared to prior, better seen on previous study.  IMPRESSION:  1.  No  acute intracranial abnormality. 2.  Right periorbital subcutaneous hematoma. Marked swelling over the left zygomatic arch and into the left cheek extending to the left parotid is presumably post-traumatic and compatible with contusion. 3.  Small left parietal scalp contusion.  CT ORBITS WITHOUT CONTRAST  Technique:  Multidetector CT imaging of the orbits was performed following the standard protocol without intravenous contrast.  Findings:  There is no orbital fracture.  Marker overlies the right side of face.  Orbital floor and medial orbital wall appears intact.  Ocular changes as described above.  Right periorbital subcutaneous hematoma.  No retro bulb are or intraconal hematoma. Globes appear intact.  Marked soft tissue swelling is present in the left cheek extending to the left parotid, partially visualized. Presumably this is post-traumatic.  IMPRESSION:  Negative for orbital fracture.  CT CERVICAL SPINE  Findings: Reversal  of the normal cervical lordosis is present. Apex of the reversal is at C5-C6.  There is no cervical spine fracture or dislocation.  Severe facet arthrosis and degenerative disc disease is present most pronounced at C4-C5 through C5-C6. Craniocervical alignment is within normal limits.  Odontoid is intact.  Mastoid air cells appear within normal limits.  The degenerative changes with mild thickening of the transverse ligament of the atlas and calcification.  Carotid atherosclerosis. 3 mm anterolisthesis of C4 ounce C5 appears degenerative, associated with facet arthrosis.  Mild to moderate bony central stenosis at C4-C5 and C5-C6.  Multilevel foraminal stenosis, generally worse on the left.  IMPRESSION: No acute abnormality.  No cervical spine fracture or dislocation. Moderate to severe multilevel cervical spondylosis.  Original Report Authenticated By: Andreas Newport, M.D.    Review of Systems  Constitutional: Negative for fever and diaphoresis.  HENT: Negative for congestion and sore  throat.   Eyes: Negative for blurred vision.  Respiratory: Negative for cough and shortness of breath.   Cardiovascular: Negative for chest pain, palpitations, orthopnea, claudication, leg swelling and PND.  Gastrointestinal: Negative for nausea, vomiting, abdominal pain and blood in stool.  Genitourinary: Negative for dysuria and hematuria.  Musculoskeletal: Positive for myalgias.       Facial/head pain.  Neurological: Negative for dizziness and headaches.   Blood pressure 148/71, pulse 87, temperature 97.9 F (36.6 C), temperature source Oral, resp. rate 20, SpO2 95.00%. Physical Exam  Constitutional: She is oriented to person, place, and time. She appears well-developed. No distress.       Appears her stated age.  HENT:  Head: Normocephalic.  Mouth/Throat: No oropharyngeal exudate.       Right orbital edema and ecchymosis Left side facial trauma with edema and ecchymosis  Eyes: EOM are normal. No scleral icterus.       Dilated right pupil. Left pupil approximately 2 mm and reactive to light accommodation  Neck: Normal range of motion. No JVD present.  Cardiovascular: Normal rate and regular rhythm.   No murmur heard. Pulses:      Radial pulses are 2+ on the right side, and 2+ on the left side.       Dorsalis pedis pulses are 1+ on the right side, and 1+ on the left side.       Posterior tibial pulses are 1+ on the right side, and 1+ on the left side.       No carotid bruit  Respiratory: Effort normal and breath sounds normal. She has no rales.  GI: Soft. Bowel sounds are normal. There is no tenderness.  Musculoskeletal:       No LEE   Lymphadenopathy:    She has no cervical adenopathy.  Neurological: She is alert and oriented to person, place, and time. She exhibits normal muscle tone.  Skin: Skin is warm and dry.  Psychiatric: She has a normal mood and affect.    Assessment/Plan: Patient Active Hospital Problem List: Syncope (06/18/2012) HYPERLIPIDEMIA  (10/31/2010) HYPERTENSION (10/31/2010) CORONARY ARTERY DISEASE (10/31/2010) Memory loss (10/31/2010)  Plan:  Leta Jungling with [employer] has been contacted for pacemaker interrogation to determine if there is an arrhythmic explanation to her fall.  She has a mildly elevated total CK and CK-MB. Troponin is negative.  EKG  Shows a-pacing at 72bpm..  She is mildly hypotensive.   HAGER, BRYAN 06/18/2012, 1:38 PM   I have seen and examined the patient along with Wilburt Finlay, PA.  I have reviewed the chart, notes and new data.  I agree with PA's note.  Key new complaints: she is convinced she tripped on a folding table, no loss of consciousness Key examination changes: severe facial hematomas; she has a 2/6 holosystolic murmur at the LLSB Key new findings / data: pacemaker interrogation shows normal device function. No arrhythmia. Not pacemaker dependent. Normal lead parameters.  PLAN: No indication of arrhythmic syncope. PT evaluation Will review office records/echo for etiology of murmur, but doubt it has anything to do with her fall.  Thurmon Fair, MD, [facility] [phone] 06/18/2012, 4:48 PM

## 2012-06-18 NOTE — ED Provider Notes (Signed)
Medical screening examination/treatment/procedure(s) were conducted as a shared visit with non-physician practitioner(s) and myself.  I personally evaluated the patient during the encounter  Found down on floor this AM.  Patient states tripped and fell in bedroom but was found in kitchen.  Denies LOC. NO focal neuro deficits.  Facial ecchymosis and hematomas.  EOMI.  R pupil dilated and nonreactive at baseline.  Glynn Octave, MD 06/18/12 1758

## 2012-06-18 NOTE — ED Provider Notes (Signed)
History     CSN: [id number]  Arrival date & time 06/18/12  7829   First MD Initiated Contact with Patient 06/18/12 [phone]      Chief Complaint  Patient presents with  . Fall    (Consider location/radiation/quality/duration/timing/severity/associated sxs/prior treatment) HPI  78 year old female who lives independently in no acute distress brought in by EMS found unconscious on the kitchen floor this morning by her grandson. Patient called her grandson at 8 AM. Patient cannot recall the cause of the fall. She states around 2 or 3 AM she went to the restroom and fell however patient was found in the kitchen floor this morning. Lost urinary continence this a.m. Patient reports pain to right hip denies headache, nausea and vomiting. Her grandson accompanies her he states her in her confusion has been increasing over the course of the last 2 weeks and she is having trouble taking her medications.  Past Medical History  Diagnosis Date  . CAD (coronary artery disease)   . Headache   . Hyperlipidemia   . Hypertension   . Osteoporosis     Past Surgical History  Procedure Date  . Total hip arthroplasty   . Orif hip fracture   . Lumbar laminectomy   . Lumbar fusion   . Cardiac pacemaker placement   . Coronary angioplasty with stent placement   . Tonsillectomy     Family History  Problem Relation Age of Onset  . Arthritis Other   . Hypertension Other     History  Substance Use Topics  . Smoking status: Former Games developer  . Smokeless tobacco: Not on file  . Alcohol Use: No    OB History    Grav Para Term Preterm Abortions TAB SAB Ect Mult Living                  Review of Systems  Allergies  Atropine; Barbiturates; and Nembutal  Home Medications   Current Outpatient Rx  Name Route Sig Dispense Refill  . AMLODIPINE BESYLATE 5 MG PO TABS Oral Take 5 mg by mouth 2 (two) times daily.     . ASPIRIN EC 81 MG PO TBEC Oral Take 81 mg by mouth daily.    Marland Kitchen CARVEDILOL 3.125 MG  PO TABS Oral Take 3.125 mg by mouth 2 (two) times daily with a meal.      . CELECOXIB 200 MG PO CAPS Oral Take 200 mg by mouth daily.      Marland Kitchen VITAMIN D3 50000 UNITS PO CAPS Oral Take 1 capsule by mouth every 30 (thirty) days.     . CLOPIDOGREL BISULFATE 75 MG PO TABS Oral Take 75 mg by mouth daily.      Marland Kitchen PROLIA Ovilla Subcutaneous Inject 1 each into the skin every 6 (six) months.    . DONEPEZIL HCL 5 MG PO TABS Oral Take 5 mg by mouth at bedtime.    Marland Kitchen LOSARTAN POTASSIUM 100 MG PO TABS Oral Take 100 mg by mouth daily.      Marland Kitchen MIRABEGRON ER 25 MG PO TB24 Oral Take 25 mg by mouth at bedtime.    . ICAPS PO Oral Take 1 tablet by mouth daily.     Marland Kitchen OVER THE COUNTER MEDICATION Oral Take 1 tablet by mouth at bedtime. laxative    . TIMOLOL HEMIHYDRATE 0.25 % OP SOLN Both Eyes Place 1-2 drops into both eyes at bedtime.      . TRAZODONE HCL 100 MG PO TABS  TAKE 1 TABLET BY  MOUTH AT BEDTIME 30 tablet 0  . DETROL LA 4 MG PO CP24  TAKE 1 TABLET BY MOUTH EVERY DAY FOR OVERACTIVE BLADDER 30 capsule 10  . SUPER HIGH VITAMINS/MINERALS PO TABS Oral Take 1 tablet by mouth daily.        BP 148/71  Pulse 87  Temp 97.9 F (36.6 C) (Oral)  Resp 20  SpO2 95%  Physical Exam  Nursing note and vitals reviewed. Constitutional: She is oriented to person, place, and time. She appears well-developed and well-nourished. No distress.  HENT:  Head: Normocephalic.  Right Ear: External ear normal.  Mouth/Throat: Oropharynx is clear and moist.       Right orbit contusion with swelling. No crepitus. No pain with extraocular movement.  Eyes:       Right Blown Pupil. Left small minimally reactive.   Neck: Normal range of motion. No JVD present. No tracheal deviation present.  Cardiovascular: Normal rate, regular rhythm and normal heart sounds.   Pulmonary/Chest: Effort normal and breath sounds normal. No respiratory distress. She has no wheezes. She has no rales.  Abdominal: Soft. Bowel sounds are normal. She exhibits no  distension and no mass. There is no tenderness. There is no guarding.  Musculoskeletal: Normal range of motion.       Full range of motion in major joints and all extremities. Pain with left hip flexion.  Neurological: She is alert and oriented to person, place, and time.       Right pupil enlarged. Left pinpoint minimally reactive pupil. Strength of Extremities 5/5. CN 3-12 intact.   Skin: Skin is warm.    ED Course  Procedures (including critical care time)  Labs Reviewed  URINALYSIS, ROUTINE W REFLEX MICROSCOPIC - Abnormal; Notable for the following:    APPearance HAZY (*)     Hgb urine dipstick LARGE (*)     Leukocytes, UA SMALL (*)     All other components within normal limits  CBC WITH DIFFERENTIAL - Abnormal; Notable for the following:    WBC 15.5 (*)     Neutrophils Relative 84 (*)     Neutro Abs 13.0 (*)     Lymphocytes Relative 10 (*)     All other components within normal limits  BASIC METABOLIC PANEL - Abnormal; Notable for the following:    Glucose, Bld 167 (*)     GFR calc non Af Amer 57 (*)     GFR calc Af Amer 67 (*)     All other components within normal limits  CARDIAC PANEL(CRET KIN+CKTOT+MB+TROPI) - Abnormal; Notable for the following:    Total CK 291 (*)     CK, MB 7.9 (*)     Relative Index 2.7 (*)     All other components within normal limits  URINE MICROSCOPIC-ADD ON - Abnormal; Notable for the following:    Squamous Epithelial / LPF FEW (*)     Bacteria, UA FEW (*)     Casts GRANULAR CAST (*)     All other components within normal limits  POCT I-STAT TROPONIN I   Ct Head Wo Contrast  06/18/2012  *RADIOLOGY REPORT*  Clinical Data:  Found on floor.  Unwitnessed fall.  Found down. Question loss of consciousness.  Right periorbital hematoma. Headache.  CT HEAD WITHOUT CONTRAST CT CERVICAL SPINE WITHOUT CONTRAST  Technique:  Multidetector CT imaging of the head and cervical spine was performed following the standard protocol without intravenous contrast.   Multiplanar CT image reconstructions of the cervical spine  were also generated.  Comparison:  CT head 06/18/2012.  CT HEAD  Findings: Multiple areas of face and scalp soft tissue swelling are present.  There is a soft tissue hematoma lateral to the right orbital rim measuring 21 mm x 13 mm (image 6 series 3).  Right ocular banding.  Bilateral lens extractions noted. There is no skull fracture.  Left parietal scalp soft tissue stranding compatible with contusion.  Stranding is present in the soft tissues overlying the left zygomatic arch, also compatible with contusion.  No mass lesion, mass effect, midline shift, hydrocephalus, hemorrhage.  No acute territorial cortical ischemia/infarct. Atrophy and chronic ischemic white matter disease is present. Left parietal and posterior temporal encephalomalacia are unchanged compared to prior, better seen on previous study.  IMPRESSION:  1.  No acute intracranial abnormality. 2.  Right periorbital subcutaneous hematoma. Marked swelling over the left zygomatic arch and into the left cheek extending to the left parotid is presumably post-traumatic and compatible with contusion. 3.  Small left parietal scalp contusion.  CT ORBITS WITHOUT CONTRAST  Technique:  Multidetector CT imaging of the orbits was performed following the standard protocol without intravenous contrast.  Findings:  There is no orbital fracture.  Marker overlies the right side of face.  Orbital floor and medial orbital wall appears intact.  Ocular changes as described above.  Right periorbital subcutaneous hematoma.  No retro bulb are or intraconal hematoma. Globes appear intact.  Marked soft tissue swelling is present in the left cheek extending to the left parotid, partially visualized. Presumably this is post-traumatic.  IMPRESSION:  Negative for orbital fracture.  CT CERVICAL SPINE  Findings: Reversal of the normal cervical lordosis is present. Apex of the reversal is at C5-C6.  There is no cervical spine  fracture or dislocation.  Severe facet arthrosis and degenerative disc disease is present most pronounced at C4-C5 through C5-C6. Craniocervical alignment is within normal limits.  Odontoid is intact.  Mastoid air cells appear within normal limits.  The degenerative changes with mild thickening of the transverse ligament of the atlas and calcification.  Carotid atherosclerosis. 3 mm anterolisthesis of C4 ounce C5 appears degenerative, associated with facet arthrosis.  Mild to moderate bony central stenosis at C4-C5 and C5-C6.  Multilevel foraminal stenosis, generally worse on the left.  IMPRESSION: No acute abnormality.  No cervical spine fracture or dislocation. Moderate to severe multilevel cervical spondylosis.  Original Report Authenticated By: Andreas Newport, M.D.   Ct Cervical Spine Wo Contrast  06/18/2012  *RADIOLOGY REPORT*  Clinical Data:  Found on floor.  Unwitnessed fall.  Found down. Question loss of consciousness.  Right periorbital hematoma. Headache.  CT HEAD WITHOUT CONTRAST CT CERVICAL SPINE WITHOUT CONTRAST  Technique:  Multidetector CT imaging of the head and cervical spine was performed following the standard protocol without intravenous contrast.  Multiplanar CT image reconstructions of the cervical spine were also generated.  Comparison:  CT head 06/18/2012.  CT HEAD  Findings: Multiple areas of face and scalp soft tissue swelling are present.  There is a soft tissue hematoma lateral to the right orbital rim measuring 21 mm x 13 mm (image 6 series 3).  Right ocular banding.  Bilateral lens extractions noted. There is no skull fracture.  Left parietal scalp soft tissue stranding compatible with contusion.  Stranding is present in the soft tissues overlying the left zygomatic arch, also compatible with contusion.  No mass lesion, mass effect, midline shift, hydrocephalus, hemorrhage.  No acute territorial cortical ischemia/infarct. Atrophy and chronic  ischemic white matter disease is present.  Left parietal and posterior temporal encephalomalacia are unchanged compared to prior, better seen on previous study.  IMPRESSION:  1.  No acute intracranial abnormality. 2.  Right periorbital subcutaneous hematoma. Marked swelling over the left zygomatic arch and into the left cheek extending to the left parotid is presumably post-traumatic and compatible with contusion. 3.  Small left parietal scalp contusion.  CT ORBITS WITHOUT CONTRAST  Technique:  Multidetector CT imaging of the orbits was performed following the standard protocol without intravenous contrast.  Findings:  There is no orbital fracture.  Marker overlies the right side of face.  Orbital floor and medial orbital wall appears intact.  Ocular changes as described above.  Right periorbital subcutaneous hematoma.  No retro bulb are or intraconal hematoma. Globes appear intact.  Marked soft tissue swelling is present in the left cheek extending to the left parotid, partially visualized. Presumably this is post-traumatic.  IMPRESSION:  Negative for orbital fracture.  CT CERVICAL SPINE  Findings: Reversal of the normal cervical lordosis is present. Apex of the reversal is at C5-C6.  There is no cervical spine fracture or dislocation.  Severe facet arthrosis and degenerative disc disease is present most pronounced at C4-C5 through C5-C6. Craniocervical alignment is within normal limits.  Odontoid is intact.  Mastoid air cells appear within normal limits.  The degenerative changes with mild thickening of the transverse ligament of the atlas and calcification.  Carotid atherosclerosis. 3 mm anterolisthesis of C4 ounce C5 appears degenerative, associated with facet arthrosis.  Mild to moderate bony central stenosis at C4-C5 and C5-C6.  Multilevel foraminal stenosis, generally worse on the left.  IMPRESSION: No acute abnormality.  No cervical spine fracture or dislocation. Moderate to severe multilevel cervical spondylosis.  Original Report Authenticated By:  Andreas Newport, M.D.   Ct Orbitss W/o Cm  06/18/2012  *RADIOLOGY REPORT*  Clinical Data:  Found on floor.  Unwitnessed fall.  Found down. Question loss of consciousness.  Right periorbital hematoma. Headache.  CT HEAD WITHOUT CONTRAST CT CERVICAL SPINE WITHOUT CONTRAST  Technique:  Multidetector CT imaging of the head and cervical spine was performed following the standard protocol without intravenous contrast.  Multiplanar CT image reconstructions of the cervical spine were also generated.  Comparison:  CT head 06/18/2012.  CT HEAD  Findings: Multiple areas of face and scalp soft tissue swelling are present.  There is a soft tissue hematoma lateral to the right orbital rim measuring 21 mm x 13 mm (image 6 series 3).  Right ocular banding.  Bilateral lens extractions noted. There is no skull fracture.  Left parietal scalp soft tissue stranding compatible with contusion.  Stranding is present in the soft tissues overlying the left zygomatic arch, also compatible with contusion.  No mass lesion, mass effect, midline shift, hydrocephalus, hemorrhage.  No acute territorial cortical ischemia/infarct. Atrophy and chronic ischemic white matter disease is present. Left parietal and posterior temporal encephalomalacia are unchanged compared to prior, better seen on previous study.  IMPRESSION:  1.  No acute intracranial abnormality. 2.  Right periorbital subcutaneous hematoma. Marked swelling over the left zygomatic arch and into the left cheek extending to the left parotid is presumably post-traumatic and compatible with contusion. 3.  Small left parietal scalp contusion.  CT ORBITS WITHOUT CONTRAST  Technique:  Multidetector CT imaging of the orbits was performed following the standard protocol without intravenous contrast.  Findings:  There is no orbital fracture.  Marker overlies the right side of face.  Orbital floor and medial orbital wall appears intact.  Ocular changes as described above.  Right periorbital  subcutaneous hematoma.  No retro bulb are or intraconal hematoma. Globes appear intact.  Marked soft tissue swelling is present in the left cheek extending to the left parotid, partially visualized. Presumably this is post-traumatic.  IMPRESSION:  Negative for orbital fracture.  CT CERVICAL SPINE  Findings: Reversal of the normal cervical lordosis is present. Apex of the reversal is at C5-C6.  There is no cervical spine fracture or dislocation.  Severe facet arthrosis and degenerative disc disease is present most pronounced at C4-C5 through C5-C6. Craniocervical alignment is within normal limits.  Odontoid is intact.  Mastoid air cells appear within normal limits.  The degenerative changes with mild thickening of the transverse ligament of the atlas and calcification.  Carotid atherosclerosis. 3 mm anterolisthesis of C4 ounce C5 appears degenerative, associated with facet arthrosis.  Mild to moderate bony central stenosis at C4-C5 and C5-C6.  Multilevel foraminal stenosis, generally worse on the left.  IMPRESSION: No acute abnormality.  No cervical spine fracture or dislocation. Moderate to severe multilevel cervical spondylosis.  Original Report Authenticated By: Andreas Newport, M.D.     1. Syncope and collapse       MDM  Patient removed from the backboard after logroll with no step-offs or midline tenderness below before the C-spine. C-collar left on pending CT.    Date: 06/18/2012  Rate: 72  Rhythm: normal sinus rhythm  QRS Axis: normal  Intervals: normal  ST/T Wave abnormalities: normal  Conduction Disutrbances:none  Narrative Interpretation:   Old EKG Reviewed: unchanged  Head ct, Orbital CT and c-spine CT, EKG and UA normal. Right hip, maxillofacial to look at left jaw, and CXR pending. Pt admitted for unknown cause of syncope to med surg for observation under Dr. Kerri Perches triad team 9.       Wynetta Emery, PA-C 06/18/12 1249

## 2012-06-18 NOTE — ED Notes (Signed)
Per EMS, pt is from home, found unconscious by grandson on floor. Right pupil questionable blown according to EMS, left pupil pinpoint. 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 Pt informed EMS she fell a couple times during the night. Presents with a large hematoma to her right face. She was incontinent and cold to touch per EMS. Pt is on plavix and does have a hx of right hip fracture. CBG 147; 178/82, HR 68 and paced. 20g LFA. Pt is alert and oriented.

## 2012-06-18 NOTE — ED Notes (Signed)
Pt returned from xray

## 2012-06-18 NOTE — ED Notes (Signed)
Pt placed back on cardiac monitor, BP cuff, and pulse ox.

## 2012-06-18 NOTE — ED Notes (Signed)
Joni Reining, PA back at bedside

## 2012-06-18 NOTE — H&P (Signed)
[facility]                                                                                    Patient Demographics  [name], is a 78 y.o. female  CSN: [id number]  MRN: [id number]  DOB - [birth year]  Admit Date - 06/18/2012  Outpatient Primary MD for the patient is [name], MD   With History of -  Past Medical History  Diagnosis Date  . CAD (coronary artery disease)   . Headache   . Hyperlipidemia   . Hypertension   . Osteoporosis       Past Surgical History  Procedure Date  . Total hip arthroplasty   . Orif hip fracture   . Lumbar laminectomy   . Lumbar fusion   . Cardiac pacemaker placement   . Coronary angioplasty with stent placement   . Tonsillectomy     in for   Chief Complaint  Patient presents with  . Fall     HPI  [name]  is a 78 y.o. female, with past medical history of coronary artery disease, tachybrady syndrome  status post permanent pacemaker,Been followed by [facility], patient presents today with syncope, patient reports yesterday night she went to go to the bathroom where she felt weak and her legs buckled and she fell on the floor, but she denies any loss of consciousness during that episode where she reports she still up and went back to her, patient reports she went another time to the bathroom over the night where she reports this time and again she she felt weak and fell, where she called her grandson who came and found her on the kitchen floor, and called EMS, the grandson reports as well while he was there she did loss consciousness couple of episodes as well, patient is poor historian , appears to be mildly confused about giving exact details of those falls, patient patient had another episodes of presyncope over the last few years which was thought to be secondary to her hypertensive medication where they were decreased,  patient's troponin were negative, EKG did not show any significant ST or T wave  changes, patient had significant bruise in the right high and right cheek area as well as the left neck and periocular area, CT brain only showed evidence of hematoma but no intracranial bleed. Patient denies any lightheadedness, or loss of consciousness(even though she was noticed by grandson to have loss of consciousness) denies any chest pain ,shortness of breath palpitation or dizziness.    Review of Systems    In addition to the HPI above,  No Fever-chills, No Headache, No changes with Vision or hearing, No problems swallowing food or Liquids, No Chest pain, Cough or Shortness of Breath, No Abdominal pain, No Nausea or Vommitting, Bowel movements are regular, No Blood in stool or Urine, No dysuria, No new skin rashes or bruises, No new joints pains-aches,  No new weakness, tingling, numbness in any extremity, No recent weight gain or loss, No polyuria, polydypsia or polyphagia, No significant Mental Stressors.  A full 10 point Review of Systems was done, except as stated above, all other  Review of Systems were negative.   Social History History  Substance Use Topics  . Smoking status: Former Games developer  . Smokeless tobacco: Not on file  . Alcohol Use: No     Family History Family History  Problem Relation Age of Onset  . Arthritis Other   . Hypertension Other      Prior to Admission medications   Medication Sig Start Date End Date Taking? Authorizing Provider  amLODipine (NORVASC) 5 MG tablet Take 5 mg by mouth 2 (two) times daily.    Yes Historical Provider, MD  aspirin EC 81 MG tablet Take 81 mg by mouth daily.   Yes Historical Provider, MD  CALCIUM PO Take 1 tablet by mouth daily.   Yes Historical Provider, MD  carvedilol (COREG) 3.125 MG tablet Take 3.125 mg by mouth 2 (two) times daily with a meal.     Yes Historical Provider, MD  celecoxib (CELEBREX) 200 MG capsule Take 200 mg by mouth daily.     Yes Historical Provider, MD  Cholecalciferol (VITAMIN D3) 50000  UNITS CAPS Take 1 capsule by mouth every 30 (thirty) days.    Yes Historical Provider, MD  clopidogrel (PLAVIX) 75 MG tablet Take 75 mg by mouth daily.     Yes Historical Provider, MD  Denosumab (PROLIA [place]) Inject 1 each into the skin every 6 (six) months.   Yes Historical Provider, MD  donepezil (ARICEPT) 5 MG tablet Take 5 mg by mouth at bedtime.   Yes Historical Provider, MD  losartan (COZAAR) 100 MG tablet Take 100 mg by mouth daily.     Yes Historical Provider, MD  mirabegron ER (MYRBETRIQ) 25 MG TB24 Take 25 mg by mouth at bedtime.   Yes Historical Provider, MD  Multiple Vitamins-Minerals (ICAPS PO) Take 1 tablet by mouth daily.    Yes Historical Provider, MD  OVER THE COUNTER MEDICATION Take 1 tablet by mouth at bedtime. laxative   Yes Historical Provider, MD  timolol (BETIMOL) 0.25 % ophthalmic solution Place 1-2 drops into both eyes at bedtime.     Yes Historical Provider, MD  Timolol Maleate (TIMOPTIC OP) Place into both eyes daily.   Yes Historical Provider, MD  traZODone (DESYREL) 100 MG tablet TAKE 1 TABLET BY MOUTH AT BEDTIME 06/16/12  Yes Etta Grandchild, MD  DETROL LA 4 MG 24 hr capsule TAKE 1 TABLET BY MOUTH EVERY DAY FOR OVERACTIVE BLADDER 01/18/12   Etta Grandchild, MD  Multiple Vitamins-Minerals (MULTIVITAMIN,TX-MINERALS) tablet Take 1 tablet by mouth daily.      Historical Provider, MD    Allergies  Allergen Reactions  . Atropine   . Barbiturates   . Nembutal (Pentobarbital Sodium)     Physical Exam  Vitals  Blood pressure 148/71, pulse 87, temperature 97.9 F (36.6 C), temperature source Oral, resp. rate 20, SpO2 95.00%.   1. Generalelderly female  lying in bed in NAD,    2. Normal affect and insight, Not Suicidal or Homicidal, Awake Alert, Oriented X 3.  3. No F.N deficits, ALL C.Nerves Intact, Strength 5/5 all 4 extremities, Sensation intact all 4 extremities, Plantars down going.  4. Ears and Eyes appear Normal, Conjunctivae clear,  right eye and he anisocoria  . Moist Oral Mucosa.Right lateral orbital hematoma multiple facial bruises mainly in the left neck chain and surrounding ear area  5. Supple Neck, No JVD, No cervical lymphadenopathy appriciated, No Carotid Bruits.  6. Symmetrical Chest wall movement, Good air movement bilaterally, CTAB.  7. RRR, No Gallops,  Rubs or Murmurs, No Parasternal Heave.  8. Positive Bowel Sounds, Abdomen Soft, Non tender, No organomegaly appriciated,No rebound -guarding or rigidity.  9.  No Cyanosis, Normal Skin Turgor, No Skin Rash or Bruise.  10. Good muscle tone,  joints appear normal , no effusions, Normal ROM.  11. No Palpable Lymph Nodes in Neck or Axillae    Data Review  CBC  Lab 06/18/12 0937  WBC 15.5*  HGB 12.5  HCT 36.7  PLT 153  MCV 92.4  MCH 31.5  MCHC 34.1  RDW 13.7  LYMPHSABS 1.6  MONOABS 0.9  EOSABS 0.1  BASOSABS 0.0  BANDABS --   ------------------------------------------------------------------------------------------------------------------  Chemistries   Lab 06/18/12 0937  NA 140  K 3.5  CL 100  CO2 31  GLUCOSE 167*  BUN 21  CREATININE 0.88  CALCIUM 9.8  MG --  AST --  ALT --  ALKPHOS --  BILITOT --   ------------------------------------------------------------------------------------------------------------------ CrCl is unknown because both a height and weight (above a minimum accepted value) are required for this calculation. ------------------------------------------------------------------------------------------------------------------ No results found for this basename: TSH,T4TOTAL,FREET3,T3FREE,THYROIDAB in the last 72 hours   Coagulation profile No results found for this basename: INR:5,PROTIME:5 in the last 168 hours ------------------------------------------------------------------------------------------------------------------- No results found for this basename: DDIMER:2 in the last 72  hours -------------------------------------------------------------------------------------------------------------------  Cardiac Enzymes  Lab 06/18/12 0950  CKMB 7.9*  TROPONINI <0.30  MYOGLOBIN --   ------------------------------------------------------------------------------------------------------------------ No components found with this basename: POCBNP:3   ---------------------------------------------------------------------------------------------------------------  Urinalysis    Component Value Date/Time   COLORURINE YELLOW 06/18/2012 0947   APPEARANCEUR HAZY* 06/18/2012 0947   LABSPEC 1.011 06/18/2012 0947   PHURINE 7.5 06/18/2012 0947   GLUCOSEU NEGATIVE 06/18/2012 0947   HGBUR LARGE* 06/18/2012 0947   BILIRUBINUR NEGATIVE 06/18/2012 0947   KETONESUR NEGATIVE 06/18/2012 0947   PROTEINUR NEGATIVE 06/18/2012 0947   UROBILINOGEN 0.2 06/18/2012 0947   NITRITE NEGATIVE 06/18/2012 0947   LEUKOCYTESUR SMALL* 06/18/2012 0947    ----------------------------------------------------------------------------------------------------------------    Imaging results:   Dg Hip Complete Right  06/18/2012  *RADIOLOGY REPORT*  Clinical Data: Fall.  Right hip pain.  RIGHT HIP - COMPLETE 2+ VIEW  Comparison: 06/05/2010.  Findings: There are new right parasymphyseal pubic fractures which appear acute, with sharply marginated edges.  Probable complimentary fracture involving the mid right inferior pubic ramus.  The sacral arcades appear intact.  The left obturator ring is intact. Iliac artery atherosclerosis.  Right total hip arthroplasty is present with unchanged displacement of the lesser trochanter from old fracture.  IMPRESSION: Acute nondisplaced right obturator ring fractures.  No contralateral obturator ring or sacral fracture identified.  Original Report Authenticated By: Andreas Newport, M.D.   Ct Head Wo Contrast  06/18/2012  *RADIOLOGY REPORT*  Clinical Data:  Found on floor.   Unwitnessed fall.  Found down. Question loss of consciousness.  Right periorbital hematoma. Headache.  CT HEAD WITHOUT CONTRAST CT CERVICAL SPINE WITHOUT CONTRAST  Technique:  Multidetector CT imaging of the head and cervical spine was performed following the standard protocol without intravenous contrast.  Multiplanar CT image reconstructions of the cervical spine were also generated.  Comparison:  CT head 06/18/2012.  CT HEAD  Findings: Multiple areas of face and scalp soft tissue swelling are present.  There is a soft tissue hematoma lateral to the right orbital rim measuring 21 mm x 13 mm (image 6 series 3).  Right ocular banding.  Bilateral lens extractions noted. There is no skull fracture.  Left parietal scalp soft tissue stranding compatible  with contusion.  Stranding is present in the soft tissues overlying the left zygomatic arch, also compatible with contusion.  No mass lesion, mass effect, midline shift, hydrocephalus, hemorrhage.  No acute territorial cortical ischemia/infarct. Atrophy and chronic ischemic white matter disease is present. Left parietal and posterior temporal encephalomalacia are unchanged compared to prior, better seen on previous study.  IMPRESSION:  1.  No acute intracranial abnormality. 2.  Right periorbital subcutaneous hematoma. Marked swelling over the left zygomatic arch and into the left cheek extending to the left parotid is presumably post-traumatic and compatible with contusion. 3.  Small left parietal scalp contusion.  CT ORBITS WITHOUT CONTRAST  Technique:  Multidetector CT imaging of the orbits was performed following the standard protocol without intravenous contrast.  Findings:  There is no orbital fracture.  Marker overlies the right side of face.  Orbital floor and medial orbital wall appears intact.  Ocular changes as described above.  Right periorbital subcutaneous hematoma.  No retro bulb are or intraconal hematoma. Globes appear intact.  Marked soft tissue swelling  is present in the left cheek extending to the left parotid, partially visualized. Presumably this is post-traumatic.  IMPRESSION:  Negative for orbital fracture.  CT CERVICAL SPINE  Findings: Reversal of the normal cervical lordosis is present. Apex of the reversal is at C5-C6.  There is no cervical spine fracture or dislocation.  Severe facet arthrosis and degenerative disc disease is present most pronounced at C4-C5 through C5-C6. Craniocervical alignment is within normal limits.  Odontoid is intact.  Mastoid air cells appear within normal limits.  The degenerative changes with mild thickening of the transverse ligament of the atlas and calcification.  Carotid atherosclerosis. 3 mm anterolisthesis of C4 ounce C5 appears degenerative, associated with facet arthrosis.  Mild to moderate bony central stenosis at C4-C5 and C5-C6.  Multilevel foraminal stenosis, generally worse on the left.  IMPRESSION: No acute abnormality.  No cervical spine fracture or dislocation. Moderate to severe multilevel cervical spondylosis.  Original Report Authenticated By: Andreas Newport, M.D.   Ct Cervical Spine Wo Contrast  06/18/2012  *RADIOLOGY REPORT*  Clinical Data:  Found on floor.  Unwitnessed fall.  Found down. Question loss of consciousness.  Right periorbital hematoma. Headache.  CT HEAD WITHOUT CONTRAST CT CERVICAL SPINE WITHOUT CONTRAST  Technique:  Multidetector CT imaging of the head and cervical spine was performed following the standard protocol without intravenous contrast.  Multiplanar CT image reconstructions of the cervical spine were also generated.  Comparison:  CT head 06/18/2012.  CT HEAD  Findings: Multiple areas of face and scalp soft tissue swelling are present.  There is a soft tissue hematoma lateral to the right orbital rim measuring 21 mm x 13 mm (image 6 series 3).  Right ocular banding.  Bilateral lens extractions noted. There is no skull fracture.  Left parietal scalp soft tissue stranding compatible  with contusion.  Stranding is present in the soft tissues overlying the left zygomatic arch, also compatible with contusion.  No mass lesion, mass effect, midline shift, hydrocephalus, hemorrhage.  No acute territorial cortical ischemia/infarct. Atrophy and chronic ischemic white matter disease is present. Left parietal and posterior temporal encephalomalacia are unchanged compared to prior, better seen on previous study.  IMPRESSION:  1.  No acute intracranial abnormality. 2.  Right periorbital subcutaneous hematoma. Marked swelling over the left zygomatic arch and into the left cheek extending to the left parotid is presumably post-traumatic and compatible with contusion. 3.  Small left parietal scalp contusion.  CT ORBITS WITHOUT CONTRAST  Technique:  Multidetector CT imaging of the orbits was performed following the standard protocol without intravenous contrast.  Findings:  There is no orbital fracture.  Marker overlies the right side of face.  Orbital floor and medial orbital wall appears intact.  Ocular changes as described above.  Right periorbital subcutaneous hematoma.  No retro bulb are or intraconal hematoma. Globes appear intact.  Marked soft tissue swelling is present in the left cheek extending to the left parotid, partially visualized. Presumably this is post-traumatic.  IMPRESSION:  Negative for orbital fracture.  CT CERVICAL SPINE  Findings: Reversal of the normal cervical lordosis is present. Apex of the reversal is at C5-C6.  There is no cervical spine fracture or dislocation.  Severe facet arthrosis and degenerative disc disease is present most pronounced at C4-C5 through C5-C6. Craniocervical alignment is within normal limits.  Odontoid is intact.  Mastoid air cells appear within normal limits.  The degenerative changes with mild thickening of the transverse ligament of the atlas and calcification.  Carotid atherosclerosis. 3 mm anterolisthesis of C4 ounce C5 appears degenerative, associated  with facet arthrosis.  Mild to moderate bony central stenosis at C4-C5 and C5-C6.  Multilevel foraminal stenosis, generally worse on the left.  IMPRESSION: No acute abnormality.  No cervical spine fracture or dislocation. Moderate to severe multilevel cervical spondylosis.  Original Report Authenticated By: Andreas Newport, M.D.   Ct Maxillofacial Wo Cm  06/18/2012  *RADIOLOGY REPORT*  Clinical Data: Fall, left sided face bruising, right eye swollen shut.  CT MAXILLOFACIAL WITHOUT CONTRAST  Technique:  Multidetector CT imaging of the maxillofacial structures was performed. Multiplanar CT image reconstructions were also generated.  Comparison: Head CT same day  Findings: There is a small hematoma lateral to the right orbit and superior to the ygomatic arch (image 65, series 3).  There is a large hematoma within the left muscles of mastication lateral the angled jaw measuring 2.5 cm in thickness compared to the normal right side which measures 1.3 cm in thickness.  There is subcutaneus stranding and edema in the soft tissues of the left face adjacent to the mandible.  There is some stranding in the parapharyngeal space on the left additionally.  No evidence of orbital fracture.  The zygoma are intact.  No evidence of maxillary fracture or pterygoid plate fracture.  No fluid in the paranasal sinuses are frontal sinus.  Nasal bones are intact.  The mandibular condyles are located.  No evidence of mandible fracture.  IMPRESSION:  1.  A large hematoma within the left muscles of mastication. 2.  Extensive subcutaneous edema and likely hemorrhage within the soft tissues of the left face adjacent to of the angle jaw extending into the parapharyngeal space. 3.  No evidence of facial fracture. 4.  Smaller left hematoma lateral to the right lateral orbit.  Original Report Authenticated By: Genevive Bi, M.D.   Ct Orbitss W/o Cm  06/18/2012  *RADIOLOGY REPORT*  Clinical Data:  Found on floor.  Unwitnessed fall.  Found  down. Question loss of consciousness.  Right periorbital hematoma. Headache.  CT HEAD WITHOUT CONTRAST CT CERVICAL SPINE WITHOUT CONTRAST  Technique:  Multidetector CT imaging of the head and cervical spine was performed following the standard protocol without intravenous contrast.  Multiplanar CT image reconstructions of the cervical spine were also generated.  Comparison:  CT head 06/18/2012.  CT HEAD  Findings: Multiple areas of face and scalp soft tissue swelling are present.  There is a soft  tissue hematoma lateral to the right orbital rim measuring 21 mm x 13 mm (image 6 series 3).  Right ocular banding.  Bilateral lens extractions noted. There is no skull fracture.  Left parietal scalp soft tissue stranding compatible with contusion.  Stranding is present in the soft tissues overlying the left zygomatic arch, also compatible with contusion.  No mass lesion, mass effect, midline shift, hydrocephalus, hemorrhage.  No acute territorial cortical ischemia/infarct. Atrophy and chronic ischemic white matter disease is present. Left parietal and posterior temporal encephalomalacia are unchanged compared to prior, better seen on previous study.  IMPRESSION:  1.  No acute intracranial abnormality. 2.  Right periorbital subcutaneous hematoma. Marked swelling over the left zygomatic arch and into the left cheek extending to the left parotid is presumably post-traumatic and compatible with contusion. 3.  Small left parietal scalp contusion.  CT ORBITS WITHOUT CONTRAST  Technique:  Multidetector CT imaging of the orbits was performed following the standard protocol without intravenous contrast.  Findings:  There is no orbital fracture.  Marker overlies the right side of face.  Orbital floor and medial orbital wall appears intact.  Ocular changes as described above.  Right periorbital subcutaneous hematoma.  No retro bulb are or intraconal hematoma. Globes appear intact.  Marked soft tissue swelling is present in the left  cheek extending to the left parotid, partially visualized. Presumably this is post-traumatic.  IMPRESSION:  Negative for orbital fracture.  CT CERVICAL SPINE  Findings: Reversal of the normal cervical lordosis is present. Apex of the reversal is at C5-C6.  There is no cervical spine fracture or dislocation.  Severe facet arthrosis and degenerative disc disease is present most pronounced at C4-C5 through C5-C6. Craniocervical alignment is within normal limits.  Odontoid is intact.  Mastoid air cells appear within normal limits.  The degenerative changes with mild thickening of the transverse ligament of the atlas and calcification.  Carotid atherosclerosis. 3 mm anterolisthesis of C4 ounce C5 appears degenerative, associated with facet arthrosis.  Mild to moderate bony central stenosis at C4-C5 and C5-C6.  Multilevel foraminal stenosis, generally worse on the left.  IMPRESSION: No acute abnormality.  No cervical spine fracture or dislocation. Moderate to severe multilevel cervical spondylosis.  Original Report Authenticated By: Andreas Newport, M.D.    My personal review of EKG: Rhythm NSR,   Personally reviewed Old Chart   Assessment & Plan  Principal Problem:  *Syncope Active Problems:  HYPERLIPIDEMIA  HYPERTENSION  CORONARY ARTERY DISEASE  Memory loss    1. Syncope . etiology is unclear. Will check orthostatic blood pressure as patient is on multiple blood pressure medications, will admit her to telemetry unit, as she is known to have history of tachybradycardia syndrome, consuled with [facility] cardiology service for evaluation and possible interrogation of her pacemaker, will check 2-D echo and bilateral carotid Dopplers. 2. Hypertension. Will hold all blood pressure medication at this point secondary to her syncope  3. Carotid artery disease. No complaints of chest pain or shortness of breath, will continue Plavix, will resume ACE inhibitor and beta blocker when she is more  4.  Dementia/memory loss. Continue on Aricept  DVT Prophylaxis Heparin -   AM Labs Ordered, also please review Full Orders  Family Communication: Admission, patients condition and plan of care including tests being ordered have been discussed with the patient and grandson  who indicate understanding and agree with the plan and Code Status.  Code Status full    Time spent in minutes : Condition  Lenoria Farrier, Delonna Ney M.D on 06/18/2012 at 1:28 PM  Between 7am to 7pm - Pager - [phone]  After 7pm go to [email or web address] - password [email or web address]  And look for the night coverage person covering me after hours  [facility] Office  [phone]

## 2012-06-18 NOTE — ED Notes (Signed)
Spoke with communications the second time concerning paperwork for the family member.

## 2012-06-18 NOTE — ED Notes (Signed)
Grandson at bedside

## 2012-06-18 NOTE — ED Notes (Signed)
Nicole, PA at bedside 

## 2012-06-18 NOTE — Progress Notes (Signed)
  Echocardiogram 2D Echocardiogram has been performed.  [place]ian Co 06/18/2012, 6:18 PM

## 2012-06-18 NOTE — ED Notes (Signed)
[name] (grandson) [phone]

## 2012-06-18 NOTE — ED Notes (Signed)
Hospitalist at bedside 

## 2012-06-18 NOTE — ED Notes (Signed)
Pt refused to stand for orthostatic VS due to dizziness and pain in her hip

## 2012-06-18 NOTE — ED Notes (Signed)
Phlebotomy at bedside.

## 2012-06-19 ENCOUNTER — Encounter (HOSPITAL_COMMUNITY): Payer: Self-pay | Admitting: Cardiology

## 2012-06-19 DIAGNOSIS — Z950 Presence of cardiac pacemaker: Secondary | ICD-10-CM

## 2012-06-19 DIAGNOSIS — R55 Syncope and collapse: Secondary | ICD-10-CM

## 2012-06-19 DIAGNOSIS — D649 Anemia, unspecified: Secondary | ICD-10-CM

## 2012-06-19 DIAGNOSIS — S329XXA Fracture of unspecified parts of lumbosacral spine and pelvis, initial encounter for closed fracture: Secondary | ICD-10-CM | POA: Diagnosis present

## 2012-06-19 HISTORY — DX: Presence of cardiac pacemaker: Z95.0

## 2012-06-19 LAB — TROPONIN I: Troponin I: <0.3 ng/mL (ref <0.30)

## 2012-06-19 LAB — BASIC METABOLIC PANEL
CO2: 27 mEq/L (ref 19–32)
Chloride: 99 mEq/L (ref 96–112)
Glucose, Bld: 107 mg/dL — ABNORMAL HIGH (ref 70–99)
Potassium: 4.2 mEq/L (ref 3.5–5.1)
Sodium: 135 mEq/L (ref 135–145)

## 2012-06-19 LAB — CBC
Hemoglobin: 9.3 g/dL — ABNORMAL LOW (ref 12.0–15.0)
Platelets: 121 10*3/uL — ABNORMAL LOW (ref 150–400)
RBC: 2.94 MIL/uL — ABNORMAL LOW (ref 3.87–5.11)
WBC: 9 10*3/uL (ref 4.0–10.5)

## 2012-06-19 MED ORDER — PANTOPRAZOLE SODIUM 40 MG PO TBEC
40.0000 mg | DELAYED_RELEASE_TABLET | Freq: Every day | ORAL | Status: DC
  Administered 2012-06-19 – 2012-06-20 (×2): 40 mg via ORAL
  Filled 2012-06-19 (×2): qty 1

## 2012-06-19 MED ORDER — DEXTROSE 5 % IV SOLN
1.0000 g | INTRAVENOUS | Status: DC
  Administered 2012-06-19 – 2012-06-21 (×3): 1 g via INTRAVENOUS
  Filled 2012-06-19 (×4): qty 10

## 2012-06-19 MED ORDER — TIMOLOL MALEATE 0.25 % OP SOLN
1.0000 [drp] | Freq: Every day | OPHTHALMIC | Status: DC
  Administered 2012-06-19 – 2012-06-22 (×4): 1 [drp] via OPHTHALMIC
  Filled 2012-06-19: qty 5

## 2012-06-19 NOTE — Progress Notes (Signed)
Subjective: No chest pain, no SOB.  Pt states she tripped but has no recollection of injury  Objective: Vital signs in last 24 hours: Temp:  [97.9 F (36.6 C)-100.8 F (38.2 C)] 99.4 F (37.4 C) (07/26 0559) Pulse Rate:  [65-87] 78  (07/26 0401) Resp:  [16-20] 18  (07/26 0401) BP: (99-150)/(45-74) 111/60 mmHg (07/26 0602) SpO2:  [90 %-95 %] 91 % (07/26 0401) Weight:  [58.106 kg (128 lb 1.6 oz)] 58.106 kg (128 lb 1.6 oz) (07/25 1445) Weight change:  Last BM Date: 06/17/12 Intake/Output from previous day:   Intake/Output this shift:    PE:  General:alert, oriented.  Pleasant affect. Ecchymosis on Lt neck and jaw with swelling and rt. Eye.  Arm and leg with bruising. Heart:S1S2 RRR with soft systolic murmur Lungs:clear without rales rhonchi or wheezes Abd:+ BS, soft, non tender UJW:JXBJYNW no edema Neuro:oriented, drowsy, has just waked on exam.  Lab Results:  Basename 06/19/12 0147 06/18/12 0937  WBC 9.0 15.5*  HGB 9.3* 12.5  HCT 27.1* 36.7  PLT 121* 153   BMET  Basename 06/19/12 0147 06/18/12 0937  NA 135 140  K 4.2 3.5  CL 99 100  CO2 27 31  GLUCOSE 107* 167*  BUN 32* 21  CREATININE 1.22* 0.88  CALCIUM 8.5 9.8    Basename 06/19/12 0147 06/18/12 1815  TROPONINI <0.30 <0.30    Lab Results  Component Value Date   CHOL  Value: 140        ATP III CLASSIFICATION:  <200     mg/dL   Desirable  295-621  mg/dL   Borderline High  >=308    mg/dL   High 6/57/8469   HDL 48 03/20/2008   LDLCALC  Value: 80        Total Cholesterol/HDL:CHD Risk Coronary Heart Disease Risk Table                     Men   Women  1/2 Average Risk   3.4   3.3 03/20/2008   TRIG 62 03/20/2008   CHOLHDL 2.9 03/20/2008   Lab Results  Component Value Date   HGBA1C  Value: 5.3 (NOTE) The ADA recommends the following therapeutic goal for glycemic control related to Hgb A1c measurement: Goal of therapy: <6.5 Hgb A1c  Reference: American Diabetes Association: Clinical Practice Recommendations 2010,  Diabetes Care, 2010, 33: (Suppl  1). 11/27/2009     Lab Results  Component Value Date   TSH 1.70 10/31/2010    Hepatic Function Panel No results found for this basename: PROT,ALBUMIN,AST,ALT,ALKPHOS,BILITOT,BILIDIR,IBILI in the last 72 hours No results found for this basename: CHOL in the last 72 hours No results found for this basename: PROTIME in the last 72 hours    EKG: Orders placed during the hospital encounter of 06/18/12  . EKG 12-LEAD  . EKG 12-LEAD  . EKG 12-LEAD  . EKG 12-LEAD    Studies/Results: Dg Chest 1 View  06/18/2012  *RADIOLOGY REPORT*  Clinical Data: Hip/pelvic fracture, preoperative assessment  CHEST - 1 VIEW  Comparison: 01/21/2011  Findings: Left subclavian sequential transvenous pacemaker leads project at right atrium and right ventricle. Enlargement of cardiac silhouette. Calcified tortuous aorta. Pulmonary vascularity normal. Lungs clear. Question calcified granulomata projecting over right lung. No pleural effusion or pneumothorax. Compression fracture mid thoracic vertebra with evidence of prior spinal augmentation procedure.  IMPRESSION: Enlargement of cardiac silhouette post pacemaker. No acute abnormalities.  Original Report Authenticated By: Lollie Marrow, M.D.   Dg Hip  Complete Right  06/18/2012  *RADIOLOGY REPORT*  Clinical Data: Fall.  Right hip pain.  RIGHT HIP - COMPLETE 2+ VIEW  Comparison: 06/05/2010.  Findings: There are new right parasymphyseal pubic fractures which appear acute, with sharply marginated edges.  Probable complimentary fracture involving the mid right inferior pubic ramus.  The sacral arcades appear intact.  The left obturator ring is intact. Iliac artery atherosclerosis.  Right total hip arthroplasty is present with unchanged displacement of the lesser trochanter from old fracture.  IMPRESSION: Acute nondisplaced right obturator ring fractures.  No contralateral obturator ring or sacral fracture identified.  Original Report Authenticated  By: Andreas Newport, M.D.   Ct Head Wo Contrast  06/18/2012  *RADIOLOGY REPORT*  Clinical Data:  Found on floor.  Unwitnessed fall.  Found down. Question loss of consciousness.  Right periorbital hematoma. Headache.  CT HEAD WITHOUT CONTRAST CT CERVICAL SPINE WITHOUT CONTRAST  Technique:  Multidetector CT imaging of the head and cervical spine was performed following the standard protocol without intravenous contrast.  Multiplanar CT image reconstructions of the cervical spine were also generated.  Comparison:  CT head 06/18/2012.  CT HEAD  Findings: Multiple areas of face and scalp soft tissue swelling are present.  There is a soft tissue hematoma lateral to the right orbital rim measuring 21 mm x 13 mm (image 6 series 3).  Right ocular banding.  Bilateral lens extractions noted. There is no skull fracture.  Left parietal scalp soft tissue stranding compatible with contusion.  Stranding is present in the soft tissues overlying the left zygomatic arch, also compatible with contusion.  No mass lesion, mass effect, midline shift, hydrocephalus, hemorrhage.  No acute territorial cortical ischemia/infarct. Atrophy and chronic ischemic white matter disease is present. Left parietal and posterior temporal encephalomalacia are unchanged compared to prior, better seen on previous study.  IMPRESSION:  1.  No acute intracranial abnormality. 2.  Right periorbital subcutaneous hematoma. Marked swelling over the left zygomatic arch and into the left cheek extending to the left parotid is presumably post-traumatic and compatible with contusion. 3.  Small left parietal scalp contusion.  CT ORBITS WITHOUT CONTRAST  Technique:  Multidetector CT imaging of the orbits was performed following the standard protocol without intravenous contrast.  Findings:  There is no orbital fracture.  Marker overlies the right side of face.  Orbital floor and medial orbital wall appears intact.  Ocular changes as described above.  Right periorbital  subcutaneous hematoma.  No retro bulb are or intraconal hematoma. Globes appear intact.  Marked soft tissue swelling is present in the left cheek extending to the left parotid, partially visualized. Presumably this is post-traumatic.  IMPRESSION:  Negative for orbital fracture.  CT CERVICAL SPINE  Findings: Reversal of the normal cervical lordosis is present. Apex of the reversal is at C5-C6.  There is no cervical spine fracture or dislocation.  Severe facet arthrosis and degenerative disc disease is present most pronounced at C4-C5 through C5-C6. Craniocervical alignment is within normal limits.  Odontoid is intact.  Mastoid air cells appear within normal limits.  The degenerative changes with mild thickening of the transverse ligament of the atlas and calcification.  Carotid atherosclerosis. 3 mm anterolisthesis of C4 ounce C5 appears degenerative, associated with facet arthrosis.  Mild to moderate bony central stenosis at C4-C5 and C5-C6.  Multilevel foraminal stenosis, generally worse on the left.  IMPRESSION: No acute abnormality.  No cervical spine fracture or dislocation. Moderate to severe multilevel cervical spondylosis.  Original Report Authenticated By: Juliene Pina  Carlota Raspberry, M.D.   Ct Cervical Spine Wo Contrast  06/18/2012  *RADIOLOGY REPORT*  Clinical Data:  Found on floor.  Unwitnessed fall.  Found down. Question loss of consciousness.  Right periorbital hematoma. Headache.  CT HEAD WITHOUT CONTRAST CT CERVICAL SPINE WITHOUT CONTRAST  Technique:  Multidetector CT imaging of the head and cervical spine was performed following the standard protocol without intravenous contrast.  Multiplanar CT image reconstructions of the cervical spine were also generated.  Comparison:  CT head 06/18/2012.  CT HEAD  Findings: Multiple areas of face and scalp soft tissue swelling are present.  There is a soft tissue hematoma lateral to the right orbital rim measuring 21 mm x 13 mm (image 6 series 3).  Right ocular banding.   Bilateral lens extractions noted. There is no skull fracture.  Left parietal scalp soft tissue stranding compatible with contusion.  Stranding is present in the soft tissues overlying the left zygomatic arch, also compatible with contusion.  No mass lesion, mass effect, midline shift, hydrocephalus, hemorrhage.  No acute territorial cortical ischemia/infarct. Atrophy and chronic ischemic white matter disease is present. Left parietal and posterior temporal encephalomalacia are unchanged compared to prior, better seen on previous study.  IMPRESSION:  1.  No acute intracranial abnormality. 2.  Right periorbital subcutaneous hematoma. Marked swelling over the left zygomatic arch and into the left cheek extending to the left parotid is presumably post-traumatic and compatible with contusion. 3.  Small left parietal scalp contusion.  CT ORBITS WITHOUT CONTRAST  Technique:  Multidetector CT imaging of the orbits was performed following the standard protocol without intravenous contrast.  Findings:  There is no orbital fracture.  Marker overlies the right side of face.  Orbital floor and medial orbital wall appears intact.  Ocular changes as described above.  Right periorbital subcutaneous hematoma.  No retro bulb are or intraconal hematoma. Globes appear intact.  Marked soft tissue swelling is present in the left cheek extending to the left parotid, partially visualized. Presumably this is post-traumatic.  IMPRESSION:  Negative for orbital fracture.  CT CERVICAL SPINE  Findings: Reversal of the normal cervical lordosis is present. Apex of the reversal is at C5-C6.  There is no cervical spine fracture or dislocation.  Severe facet arthrosis and degenerative disc disease is present most pronounced at C4-C5 through C5-C6. Craniocervical alignment is within normal limits.  Odontoid is intact.  Mastoid air cells appear within normal limits.  The degenerative changes with mild thickening of the transverse ligament of the atlas  and calcification.  Carotid atherosclerosis. 3 mm anterolisthesis of C4 ounce C5 appears degenerative, associated with facet arthrosis.  Mild to moderate bony central stenosis at C4-C5 and C5-C6.  Multilevel foraminal stenosis, generally worse on the left.  IMPRESSION: No acute abnormality.  No cervical spine fracture or dislocation. Moderate to severe multilevel cervical spondylosis.  Original Report Authenticated By: Andreas Newport, M.D.   Ct Maxillofacial Wo Cm  06/18/2012  *RADIOLOGY REPORT*  Clinical Data: Fall, left sided face bruising, right eye swollen shut.  CT MAXILLOFACIAL WITHOUT CONTRAST  Technique:  Multidetector CT imaging of the maxillofacial structures was performed. Multiplanar CT image reconstructions were also generated.  Comparison: Head CT same day  Findings: There is a small hematoma lateral to the right orbit and superior to the ygomatic arch (image 65, series 3).  There is a large hematoma within the left muscles of mastication lateral the angled jaw measuring 2.5 cm in thickness compared to the normal right side which measures 1.3  cm in thickness.  There is subcutaneus stranding and edema in the soft tissues of the left face adjacent to the mandible.  There is some stranding in the parapharyngeal space on the left additionally.  No evidence of orbital fracture.  The zygoma are intact.  No evidence of maxillary fracture or pterygoid plate fracture.  No fluid in the paranasal sinuses are frontal sinus.  Nasal bones are intact.  The mandibular condyles are located.  No evidence of mandible fracture.  IMPRESSION:  1.  A large hematoma within the left muscles of mastication. 2.  Extensive subcutaneous edema and likely hemorrhage within the soft tissues of the left face adjacent to of the angle jaw extending into the parapharyngeal space. 3.  No evidence of facial fracture. 4.  Smaller left hematoma lateral to the right lateral orbit.  Original Report Authenticated By: Genevive Bi, M.D.    Ct Orbitss W/o Cm  06/18/2012  *RADIOLOGY REPORT*  Clinical Data:  Found on floor.  Unwitnessed fall.  Found down. Question loss of consciousness.  Right periorbital hematoma. Headache.  CT HEAD WITHOUT CONTRAST CT CERVICAL SPINE WITHOUT CONTRAST  Technique:  Multidetector CT imaging of the head and cervical spine was performed following the standard protocol without intravenous contrast.  Multiplanar CT image reconstructions of the cervical spine were also generated.  Comparison:  CT head 06/18/2012.  CT HEAD  Findings: Multiple areas of face and scalp soft tissue swelling are present.  There is a soft tissue hematoma lateral to the right orbital rim measuring 21 mm x 13 mm (image 6 series 3).  Right ocular banding.  Bilateral lens extractions noted. There is no skull fracture.  Left parietal scalp soft tissue stranding compatible with contusion.  Stranding is present in the soft tissues overlying the left zygomatic arch, also compatible with contusion.  No mass lesion, mass effect, midline shift, hydrocephalus, hemorrhage.  No acute territorial cortical ischemia/infarct. Atrophy and chronic ischemic white matter disease is present. Left parietal and posterior temporal encephalomalacia are unchanged compared to prior, better seen on previous study.  IMPRESSION:  1.  No acute intracranial abnormality. 2.  Right periorbital subcutaneous hematoma. Marked swelling over the left zygomatic arch and into the left cheek extending to the left parotid is presumably post-traumatic and compatible with contusion. 3.  Small left parietal scalp contusion.  CT ORBITS WITHOUT CONTRAST  Technique:  Multidetector CT imaging of the orbits was performed following the standard protocol without intravenous contrast.  Findings:  There is no orbital fracture.  Marker overlies the right side of face.  Orbital floor and medial orbital wall appears intact.  Ocular changes as described above.  Right periorbital subcutaneous hematoma.  No  retro bulb are or intraconal hematoma. Globes appear intact.  Marked soft tissue swelling is present in the left cheek extending to the left parotid, partially visualized. Presumably this is post-traumatic.  IMPRESSION:  Negative for orbital fracture.  CT CERVICAL SPINE  Findings: Reversal of the normal cervical lordosis is present. Apex of the reversal is at C5-C6.  There is no cervical spine fracture or dislocation.  Severe facet arthrosis and degenerative disc disease is present most pronounced at C4-C5 through C5-C6. Craniocervical alignment is within normal limits.  Odontoid is intact.  Mastoid air cells appear within normal limits.  The degenerative changes with mild thickening of the transverse ligament of the atlas and calcification.  Carotid atherosclerosis. 3 mm anterolisthesis of C4 ounce C5 appears degenerative, associated with facet arthrosis.  Mild to moderate  bony central stenosis at C4-C5 and C5-C6.  Multilevel foraminal stenosis, generally worse on the left.  IMPRESSION: No acute abnormality.  No cervical spine fracture or dislocation. Moderate to severe multilevel cervical spondylosis.  Original Report Authenticated By: Andreas Newport, M.D.    Medications: I have reviewed the medications.    . celecoxib  200 mg Oral Daily  . clopidogrel  75 mg Oral Daily  . docusate sodium  100 mg Oral BID  . donepezil  5 mg Oral QHS  . fesoterodine  4 mg Oral Daily  . heparin  5,000 Units Subcutaneous Q8H  . mirabegron ER  25 mg Oral QHS  . morphine  4 mg Intravenous Once  . ondansetron (ZOFRAN) IV  4 mg Intravenous STAT  . sodium chloride  3 mL Intravenous Q12H  . timolol  1 drop Both Eyes Daily  . Vitamin D (Ergocalciferol)  50,000 Units Oral Q30 days  . DISCONTD: Vitamin D3  1 capsule Oral Q30 days   Assessment/Plan: Principal Problem:  *Syncope Active Problems:  Hx of cardiac pacemaker, medtronic for tachy/brady syndrome  HYPERLIPIDEMIA  HYPERTENSION  CORONARY ARTERY DISEASE  Memory  loss  PLAN: Echo done results pending.  Negative MI Labile BP will check orthostatics.  Drop in H/H with the bruising.   Pacemaker interrogated yesterday no arrhythmias.  LOS: 1 day   INGOLD,LAURA R 06/19/2012, 8:25 AM   Patient seen and examined. Agree with assessment and plan. Echo reviewed. Mild LVH without definitive wall motion abnormalities; EF 55 - 65%: Grade I diastolic dysfunction.  AV sclerosis without stenosis; mitral annular and submitral calcification. Mild TR and PR.  Mild Pulmonary Htn. Trivial pericardial effuson. Continue orthostatic BP assessment; f/u H/H.   Lennette Bihari, MD, [facility]  06/19/2012 10:55 AM

## 2012-06-19 NOTE — Clinical Social Work Psychosocial (Signed)
     Clinical Social Work Department BRIEF PSYCHOSOCIAL ASSESSMENT 06/19/2012  Patient:  [name], [name]     Account Number:  [id number]     Admit date:  06/18/2012  Clinical Social Worker:  Robin Searing  Date/Time:  06/19/2012 03:12 PM  Referred by:  RN  Date Referred:  06/19/2012 Referred for  SNF Placement   Other Referral:   Interview type:  Patient Other interview type:    PSYCHOSOCIAL DATA Living Status:  ALONE Admitted from facility:   Level of care:   Primary support name:  SON, GRANDSON Primary support relationship to patient:  FAMILY Degree of support available:   GOOD BUT MINIMAL ASSIST AVAILABLE AT HOME- MAY NEED ST SNF    CURRENT CONCERNS Current Concerns  Post-Acute Placement   Other Concerns:    SOCIAL WORK ASSESSMENT / PLAN Spoke with patient who states she lives alone in [place]. She fell over a bedside table at home and per RN has a hip fx. Patient implies she didn't break anything but has broken a hip in the past. SHe tells me she has been to SNF at [facility] in past after a hip fx and is already thinking this may be advantageous at d/c this time as well. Discussed SNF process with her and will begin in anticipation of needing this at d/c.   Assessment/plan status:  Other - See comment Other assessment/ plan:   Will complete FL2, PASARR and bed search for ?SNF.  Will ask MD to order PT/OT consults when appropriate.   Information/referral to community resources:   SNF's    PATIENTS/FAMILYS RESPONSE TO PLAN OF CARE: Patient agreeable to this plan as above- she states her son is currently on vacation in [place] and doesnt want to bother him about this hospitalization yet (however she thinks her grandson may have called and told him).

## 2012-06-19 NOTE — Progress Notes (Signed)
VASCULAR LAB PRELIMINARY  PRELIMINARY  PRELIMINARY  PRELIMINARY  Carotid duplex  completed.    Preliminary report:  Bilateral:  No evidence of hemodynamically significant internal carotid artery stenosis.   Vertebral artery flow is antegrade.      Della Scrivener, RVT 06/19/2012, 1:26 PM

## 2012-06-19 NOTE — Progress Notes (Addendum)
Multiple attempts to insert foley catheter (3 RN's and Attending MD).  Urology Dr. Isabel Caprice came and inserted 14 french foley catheter.  Urology MD says to make sure foley stays in until seen and assessed.  Pt slowly returned 800 ml insertion and continues to drain. Will continue to monitor. [name]

## 2012-06-19 NOTE — Consult Note (Signed)
Urology Consult   Physician requesting consult: Elgergawy  Reason for consult: Urinary retention with Inability to Pl., Foley catheter  History of Present Illness: [name] is a 78 y.o. female who has been seen in our office most recently by Dr. Ihor Gully for problems with urinary retention, voiding dysfunction and chronic cystitis. Patient was admitted with a syncopal episode and a fall. She has a nondisplaced pelvic fracture. She has been voiding frequently with incontinent episode suggestive of overflow leakage. Bladder scan suggested a to 900 cc of residual urine. Numerous attempts were made by the nursing staff to insert a Foley catheter unsuccessfully and therefore we were consulted. The patient was last seen in our office in June of 2013. She did have a recurrent episode of cystitis with Escherichia coli at that time. Patient has multiple episodes of prior urinary retention. I was able to place a 14 [place] Foley catheter with drainage of approximately 800 cc of clear urine.    Past Medical History  Diagnosis Date  . CAD (coronary artery disease)   . Headache   . Hyperlipidemia   . Hypertension   . Osteoporosis   . Pacemaker   . Hx of cardiac pacemaker, medtronic for tachy/brady syndrome 06/19/2012    Past Surgical History  Procedure Date  . Total hip arthroplasty   . Orif hip fracture   . Lumbar laminectomy   . Lumbar fusion   . Cardiac pacemaker placement   . Coronary angioplasty with stent placement   . Tonsillectomy     Home Medications:  Medications Prior to Admission  Medication Sig Dispense Refill  . amLODipine (NORVASC) 5 MG tablet Take 5 mg by mouth 2 (two) times daily.       Marland Kitchen aspirin EC 81 MG tablet Take 81 mg by mouth at bedtime.      Marland Kitchen CALCIUM PO Take 1 tablet by mouth daily.      . carvedilol (COREG) 3.125 MG tablet Take 3.125 mg by mouth 2 (two) times daily with a meal.        . celecoxib (CELEBREX) 200 MG capsule Take 200 mg by mouth daily.          . Cholecalciferol (VITAMIN D3) 50000 UNITS CAPS Take 1 capsule by mouth every 30 (thirty) days.       . clopidogrel (PLAVIX) 75 MG tablet Take 75 mg by mouth daily.        . Denosumab (PROLIA [place]) Inject 1 each into the skin every 6 (six) months.      . donepezil (ARICEPT) 5 MG tablet Take 5 mg by mouth at bedtime.      Marland Kitchen losartan (COZAAR) 100 MG tablet Take 100 mg by mouth daily.        . mirabegron ER (MYRBETRIQ) 25 MG TB24 Take 25 mg by mouth at bedtime.      . Multiple Vitamins-Minerals (ICAPS PO) Take 1 tablet by mouth daily.       . Multiple Vitamins-Minerals (MULTIVITAMIN,TX-MINERALS) tablet Take 1 tablet by mouth daily.        Marland Kitchen OVER THE COUNTER MEDICATION Take 1 tablet by mouth at bedtime. laxative      . timolol (BETIMOL) 0.25 % ophthalmic solution Place 1-2 drops into both eyes at bedtime.       . traZODone (DESYREL) 100 MG tablet Take 100 mg by mouth at bedtime.        Current Hospital Medications: Scheduled Meds:   . cefTRIAXone (ROCEPHIN)  IV  1  g Intravenous Q24H  . clopidogrel  75 mg Oral Daily  . docusate sodium  100 mg Oral BID  . donepezil  5 mg Oral QHS  . fesoterodine  4 mg Oral Daily  . mirabegron ER  25 mg Oral QHS  . pantoprazole  40 mg Oral Daily  . sodium chloride  3 mL Intravenous Q12H  . timolol  1 drop Both Eyes QHS  . Vitamin D (Ergocalciferol)  50,000 Units Oral Q30 days  . DISCONTD: celecoxib  200 mg Oral Daily  . DISCONTD: heparin  5,000 Units Subcutaneous Q8H  . DISCONTD: timolol  1 drop Both Eyes Daily   Continuous Infusions:   . sodium chloride 50 mL/hr (06/19/12 0958)   PRN Meds:.alum & mag hydroxide-simeth, bisacodyl, HYDROcodone-acetaminophen, ondansetron (ZOFRAN) IV, ondansetron  Allergies:  Allergies  Allergen Reactions  . Barbiturates Other (See Comments)    unknown  . Nembutal (Pentobarbital Sodium) Other (See Comments)    unknown  . Atropine Rash    Family History  Problem Relation Age of Onset  . Arthritis Other   .  Hypertension Other     Social History:  reports that she quit smoking about 52 years ago. She has never used smokeless tobacco. She reports that she does not drink alcohol or use illicit drugs.  ROS: A complete review of systems was performed.  All systems are negative except for pertinent findings as noted. Patient has complaints of pelvic discomfort urinary frequency, constant bladder pressure.  Physical Exam:  Vital signs in last 24 hours: Temp:  [98.4 F (36.9 C)-100.2 F (37.9 C)] 98.4 F (36.9 C) (07/26 1352) Pulse Rate:  [59-78] 59  (07/26 1352) Resp:  [18] 18  (07/26 1352) BP: (111-150)/(47-74) 124/47 mmHg (07/26 1352) SpO2:  [91 %-98 %] 98 % (07/26 1352) General:  Alert and oriented, No acute distress HEENT: Normocephalic, multiple contusions and ecchymoses. Neck: No JVD or lymphadenopathy Cardiovascular: Regular rate and rhythm Lungs: Clear bilaterally Abdomen: Soft, nontender, nondistended, no abdominal masses Back: No CVA tenderness Extremities: No edema Neurologic: Grossly intact Gynecologic: Moderate vaginal atrophy with urethral stenosis  Laboratory Data:  Results for orders placed during the hospital encounter of 06/18/12 (from the past 72 hour(s))  CBC WITH DIFFERENTIAL     Status: Abnormal   Collection Time   06/18/12  9:37 AM      Component Value Range Comment   WBC 15.5 (*) 4.0 - 10.5 K/uL    RBC 3.97  3.87 - 5.11 MIL/uL    Hemoglobin 12.5  12.0 - 15.0 g/dL    HCT 16.1  09.6 - 04.5 %    MCV 92.4  78.0 - 100.0 fL    MCH 31.5  26.0 - 34.0 pg    MCHC 34.1  30.0 - 36.0 g/dL    RDW 40.9  81.1 - 91.4 %    Platelets 153  150 - 400 K/uL    Neutrophils Relative 84 (*) 43 - 77 %    Neutro Abs 13.0 (*) 1.7 - 7.7 K/uL    Lymphocytes Relative 10 (*) 12 - 46 %    Lymphs Abs 1.6  0.7 - 4.0 K/uL    Monocytes Relative 6  3 - 12 %    Monocytes Absolute 0.9  0.1 - 1.0 K/uL    Eosinophils Relative 0  0 - 5 %    Eosinophils Absolute 0.1  0.0 - 0.7 K/uL    Basophils  Relative 0  0 - 1 %  Basophils Absolute 0.0  0.0 - 0.1 K/uL   BASIC METABOLIC PANEL     Status: Abnormal   Collection Time   06/18/12  9:37 AM      Component Value Range Comment   Sodium 140  135 - 145 mEq/L    Potassium 3.5  3.5 - 5.1 mEq/L    Chloride 100  96 - 112 mEq/L    CO2 31  19 - 32 mEq/L    Glucose, Bld 167 (*) 70 - 99 mg/dL    BUN 21  6 - 23 mg/dL    Creatinine, Ser 1.61  0.50 - 1.10 mg/dL    Calcium 9.8  8.4 - 09.6 mg/dL    GFR calc non Af Amer 57 (*) >90 mL/min    GFR calc Af Amer 67 (*) >90 mL/min   URINALYSIS, ROUTINE W REFLEX MICROSCOPIC     Status: Abnormal   Collection Time   06/18/12  9:47 AM      Component Value Range Comment   Color, Urine YELLOW  YELLOW    APPearance HAZY (*) CLEAR    Specific Gravity, Urine 1.011  1.005 - 1.030    pH 7.5  5.0 - 8.0    Glucose, UA NEGATIVE  NEGATIVE mg/dL    Hgb urine dipstick LARGE (*) NEGATIVE    Bilirubin Urine NEGATIVE  NEGATIVE    Ketones, ur NEGATIVE  NEGATIVE mg/dL    Protein, ur NEGATIVE  NEGATIVE mg/dL    Urobilinogen, UA 0.2  0.0 - 1.0 mg/dL    Nitrite NEGATIVE  NEGATIVE    Leukocytes, UA SMALL (*) NEGATIVE   URINE MICROSCOPIC-ADD ON     Status: Abnormal   Collection Time   06/18/12  9:47 AM      Component Value Range Comment   Squamous Epithelial / LPF FEW (*) RARE    WBC, UA 3-6  <3 WBC/hpf    RBC / HPF 11-20  <3 RBC/hpf    Bacteria, UA FEW (*) RARE    Casts GRANULAR CAST (*) NEGATIVE    Urine-Other MUCOUS PRESENT     CARDIAC PANEL(CRET KIN+CKTOT+MB+TROPI)     Status: Abnormal   Collection Time   06/18/12  9:50 AM      Component Value Range Comment   Total CK 291 (*) 7 - 177 U/L    CK, MB 7.9 (*) 0.3 - 4.0 ng/mL    Troponin I <0.30  <0.30 ng/mL    Relative Index 2.7 (*) 0.0 - 2.5   POCT I-STAT TROPONIN I     Status: Normal   Collection Time   06/18/12  9:57 AM      Component Value Range Comment   Troponin i, poc 0.00  0.00 - 0.08 ng/mL    Comment 3            POCT I-STAT TROPONIN I     Status:  Normal   Collection Time   06/18/12  1:51 PM      Component Value Range Comment   Troponin i, poc 0.02  0.00 - 0.08 ng/mL    Comment 3            TROPONIN I     Status: Normal   Collection Time   06/18/12  6:15 PM      Component Value Range Comment   Troponin I <0.30  <0.30 ng/mL   TROPONIN I     Status: Normal   Collection Time   06/19/12  1:47 AM  Component Value Range Comment   Troponin I <0.30  <0.30 ng/mL   CBC     Status: Abnormal   Collection Time   06/19/12  1:47 AM      Component Value Range Comment   WBC 9.0  4.0 - 10.5 K/uL    RBC 2.94 (*) 3.87 - 5.11 MIL/uL    Hemoglobin 9.3 (*) 12.0 - 15.0 g/dL DELTA CHECK NOTED   HCT 27.1 (*) 36.0 - 46.0 %    MCV 92.2  78.0 - 100.0 fL    MCH 31.6  26.0 - 34.0 pg    MCHC 34.3  30.0 - 36.0 g/dL    RDW 40.9  81.1 - 91.4 %    Platelets 121 (*) 150 - 400 K/uL   BASIC METABOLIC PANEL     Status: Abnormal   Collection Time   06/19/12  1:47 AM      Component Value Range Comment   Sodium 135  135 - 145 mEq/L    Potassium 4.2  3.5 - 5.1 mEq/L    Chloride 99  96 - 112 mEq/L    CO2 27  19 - 32 mEq/L    Glucose, Bld 107 (*) 70 - 99 mg/dL    BUN 32 (*) 6 - 23 mg/dL DELTA CHECK NOTED   Creatinine, Ser 1.22 (*) 0.50 - 1.10 mg/dL    Calcium 8.5  8.4 - 78.2 mg/dL    GFR calc non Af Amer 39 (*) >90 mL/min    GFR calc Af Amer 45 (*) >90 mL/min    No results found for this or any previous visit (from the past 240 hour(s)). Creatinine:  Basename 06/19/12 0147 06/18/12 0937  CREATININE 1.22* 0.88    Radiologic Imaging: Dg Chest 1 View  06/18/2012  *RADIOLOGY REPORT*  Clinical Data: Hip/pelvic fracture, preoperative assessment  CHEST - 1 VIEW  Comparison: 01/21/2011  Findings: Left subclavian sequential transvenous pacemaker leads project at right atrium and right ventricle. Enlargement of cardiac silhouette. Calcified tortuous aorta. Pulmonary vascularity normal. Lungs clear. Question calcified granulomata projecting over right lung. No  pleural effusion or pneumothorax. Compression fracture mid thoracic vertebra with evidence of prior spinal augmentation procedure.  IMPRESSION: Enlargement of cardiac silhouette post pacemaker. No acute abnormalities.  Original Report Authenticated By: Lollie Marrow, M.D.   Dg Hip Complete Right  06/18/2012  *RADIOLOGY REPORT*  Clinical Data: Fall.  Right hip pain.  RIGHT HIP - COMPLETE 2+ VIEW  Comparison: 06/05/2010.  Findings: There are new right parasymphyseal pubic fractures which appear acute, with sharply marginated edges.  Probable complimentary fracture involving the mid right inferior pubic ramus.  The sacral arcades appear intact.  The left obturator ring is intact. Iliac artery atherosclerosis.  Right total hip arthroplasty is present with unchanged displacement of the lesser trochanter from old fracture.  IMPRESSION: Acute nondisplaced right obturator ring fractures.  No contralateral obturator ring or sacral fracture identified.  Original Report Authenticated By: Andreas Newport, M.D.   Ct Head Wo Contrast  06/18/2012  *RADIOLOGY REPORT*  Clinical Data:  Found on floor.  Unwitnessed fall.  Found down. Question loss of consciousness.  Right periorbital hematoma. Headache.  CT HEAD WITHOUT CONTRAST CT CERVICAL SPINE WITHOUT CONTRAST  Technique:  Multidetector CT imaging of the head and cervical spine was performed following the standard protocol without intravenous contrast.  Multiplanar CT image reconstructions of the cervical spine were also generated.  Comparison:  CT head 06/18/2012.  CT HEAD  Findings: Multiple areas of  face and scalp soft tissue swelling are present.  There is a soft tissue hematoma lateral to the right orbital rim measuring 21 mm x 13 mm (image 6 series 3).  Right ocular banding.  Bilateral lens extractions noted. There is no skull fracture.  Left parietal scalp soft tissue stranding compatible with contusion.  Stranding is present in the soft tissues overlying the left  zygomatic arch, also compatible with contusion.  No mass lesion, mass effect, midline shift, hydrocephalus, hemorrhage.  No acute territorial cortical ischemia/infarct. Atrophy and chronic ischemic white matter disease is present. Left parietal and posterior temporal encephalomalacia are unchanged compared to prior, better seen on previous study.  IMPRESSION:  1.  No acute intracranial abnormality. 2.  Right periorbital subcutaneous hematoma. Marked swelling over the left zygomatic arch and into the left cheek extending to the left parotid is presumably post-traumatic and compatible with contusion. 3.  Small left parietal scalp contusion.  CT ORBITS WITHOUT CONTRAST  Technique:  Multidetector CT imaging of the orbits was performed following the standard protocol without intravenous contrast.  Findings:  There is no orbital fracture.  Marker overlies the right side of face.  Orbital floor and medial orbital wall appears intact.  Ocular changes as described above.  Right periorbital subcutaneous hematoma.  No retro bulb are or intraconal hematoma. Globes appear intact.  Marked soft tissue swelling is present in the left cheek extending to the left parotid, partially visualized. Presumably this is post-traumatic.  IMPRESSION:  Negative for orbital fracture.  CT CERVICAL SPINE  Findings: Reversal of the normal cervical lordosis is present. Apex of the reversal is at C5-C6.  There is no cervical spine fracture or dislocation.  Severe facet arthrosis and degenerative disc disease is present most pronounced at C4-C5 through C5-C6. Craniocervical alignment is within normal limits.  Odontoid is intact.  Mastoid air cells appear within normal limits.  The degenerative changes with mild thickening of the transverse ligament of the atlas and calcification.  Carotid atherosclerosis. 3 mm anterolisthesis of C4 ounce C5 appears degenerative, associated with facet arthrosis.  Mild to moderate bony central stenosis at C4-C5 and  C5-C6.  Multilevel foraminal stenosis, generally worse on the left.  IMPRESSION: No acute abnormality.  No cervical spine fracture or dislocation. Moderate to severe multilevel cervical spondylosis.  Original Report Authenticated By: Andreas Newport, M.D.   Ct Cervical Spine Wo Contrast  06/18/2012  *RADIOLOGY REPORT*  Clinical Data:  Found on floor.  Unwitnessed fall.  Found down. Question loss of consciousness.  Right periorbital hematoma. Headache.  CT HEAD WITHOUT CONTRAST CT CERVICAL SPINE WITHOUT CONTRAST  Technique:  Multidetector CT imaging of the head and cervical spine was performed following the standard protocol without intravenous contrast.  Multiplanar CT image reconstructions of the cervical spine were also generated.  Comparison:  CT head 06/18/2012.  CT HEAD  Findings: Multiple areas of face and scalp soft tissue swelling are present.  There is a soft tissue hematoma lateral to the right orbital rim measuring 21 mm x 13 mm (image 6 series 3).  Right ocular banding.  Bilateral lens extractions noted. There is no skull fracture.  Left parietal scalp soft tissue stranding compatible with contusion.  Stranding is present in the soft tissues overlying the left zygomatic arch, also compatible with contusion.  No mass lesion, mass effect, midline shift, hydrocephalus, hemorrhage.  No acute territorial cortical ischemia/infarct. Atrophy and chronic ischemic white matter disease is present. Left parietal and posterior temporal encephalomalacia are unchanged compared to  prior, better seen on previous study.  IMPRESSION:  1.  No acute intracranial abnormality. 2.  Right periorbital subcutaneous hematoma. Marked swelling over the left zygomatic arch and into the left cheek extending to the left parotid is presumably post-traumatic and compatible with contusion. 3.  Small left parietal scalp contusion.  CT ORBITS WITHOUT CONTRAST  Technique:  Multidetector CT imaging of the orbits was performed following the  standard protocol without intravenous contrast.  Findings:  There is no orbital fracture.  Marker overlies the right side of face.  Orbital floor and medial orbital wall appears intact.  Ocular changes as described above.  Right periorbital subcutaneous hematoma.  No retro bulb are or intraconal hematoma. Globes appear intact.  Marked soft tissue swelling is present in the left cheek extending to the left parotid, partially visualized. Presumably this is post-traumatic.  IMPRESSION:  Negative for orbital fracture.  CT CERVICAL SPINE  Findings: Reversal of the normal cervical lordosis is present. Apex of the reversal is at C5-C6.  There is no cervical spine fracture or dislocation.  Severe facet arthrosis and degenerative disc disease is present most pronounced at C4-C5 through C5-C6. Craniocervical alignment is within normal limits.  Odontoid is intact.  Mastoid air cells appear within normal limits.  The degenerative changes with mild thickening of the transverse ligament of the atlas and calcification.  Carotid atherosclerosis. 3 mm anterolisthesis of C4 ounce C5 appears degenerative, associated with facet arthrosis.  Mild to moderate bony central stenosis at C4-C5 and C5-C6.  Multilevel foraminal stenosis, generally worse on the left.  IMPRESSION: No acute abnormality.  No cervical spine fracture or dislocation. Moderate to severe multilevel cervical spondylosis.  Original Report Authenticated By: Andreas Newport, M.D.   Ct Maxillofacial Wo Cm  06/18/2012  *RADIOLOGY REPORT*  Clinical Data: Fall, left sided face bruising, right eye swollen shut.  CT MAXILLOFACIAL WITHOUT CONTRAST  Technique:  Multidetector CT imaging of the maxillofacial structures was performed. Multiplanar CT image reconstructions were also generated.  Comparison: Head CT same day  Findings: There is a small hematoma lateral to the right orbit and superior to the ygomatic arch (image 65, series 3).  There is a large hematoma within the left  muscles of mastication lateral the angled jaw measuring 2.5 cm in thickness compared to the normal right side which measures 1.3 cm in thickness.  There is subcutaneus stranding and edema in the soft tissues of the left face adjacent to the mandible.  There is some stranding in the parapharyngeal space on the left additionally.  No evidence of orbital fracture.  The zygoma are intact.  No evidence of maxillary fracture or pterygoid plate fracture.  No fluid in the paranasal sinuses are frontal sinus.  Nasal bones are intact.  The mandibular condyles are located.  No evidence of mandible fracture.  IMPRESSION:  1.  A large hematoma within the left muscles of mastication. 2.  Extensive subcutaneous edema and likely hemorrhage within the soft tissues of the left face adjacent to of the angle jaw extending into the parapharyngeal space. 3.  No evidence of facial fracture. 4.  Smaller left hematoma lateral to the right lateral orbit.  Original Report Authenticated By: Genevive Bi, M.D.   Ct Orbitss W/o Cm  06/18/2012  *RADIOLOGY REPORT*  Clinical Data:  Found on floor.  Unwitnessed fall.  Found down. Question loss of consciousness.  Right periorbital hematoma. Headache.  CT HEAD WITHOUT CONTRAST CT CERVICAL SPINE WITHOUT CONTRAST  Technique:  Multidetector CT imaging of  the head and cervical spine was performed following the standard protocol without intravenous contrast.  Multiplanar CT image reconstructions of the cervical spine were also generated.  Comparison:  CT head 06/18/2012.  CT HEAD  Findings: Multiple areas of face and scalp soft tissue swelling are present.  There is a soft tissue hematoma lateral to the right orbital rim measuring 21 mm x 13 mm (image 6 series 3).  Right ocular banding.  Bilateral lens extractions noted. There is no skull fracture.  Left parietal scalp soft tissue stranding compatible with contusion.  Stranding is present in the soft tissues overlying the left zygomatic arch, also  compatible with contusion.  No mass lesion, mass effect, midline shift, hydrocephalus, hemorrhage.  No acute territorial cortical ischemia/infarct. Atrophy and chronic ischemic white matter disease is present. Left parietal and posterior temporal encephalomalacia are unchanged compared to prior, better seen on previous study.  IMPRESSION:  1.  No acute intracranial abnormality. 2.  Right periorbital subcutaneous hematoma. Marked swelling over the left zygomatic arch and into the left cheek extending to the left parotid is presumably post-traumatic and compatible with contusion. 3.  Small left parietal scalp contusion.  CT ORBITS WITHOUT CONTRAST  Technique:  Multidetector CT imaging of the orbits was performed following the standard protocol without intravenous contrast.  Findings:  There is no orbital fracture.  Marker overlies the right side of face.  Orbital floor and medial orbital wall appears intact.  Ocular changes as described above.  Right periorbital subcutaneous hematoma.  No retro bulb are or intraconal hematoma. Globes appear intact.  Marked soft tissue swelling is present in the left cheek extending to the left parotid, partially visualized. Presumably this is post-traumatic.  IMPRESSION:  Negative for orbital fracture.  CT CERVICAL SPINE  Findings: Reversal of the normal cervical lordosis is present. Apex of the reversal is at C5-C6.  There is no cervical spine fracture or dislocation.  Severe facet arthrosis and degenerative disc disease is present most pronounced at C4-C5 through C5-C6. Craniocervical alignment is within normal limits.  Odontoid is intact.  Mastoid air cells appear within normal limits.  The degenerative changes with mild thickening of the transverse ligament of the atlas and calcification.  Carotid atherosclerosis. 3 mm anterolisthesis of C4 ounce C5 appears degenerative, associated with facet arthrosis.  Mild to moderate bony central stenosis at C4-C5 and C5-C6.  Multilevel  foraminal stenosis, generally worse on the left.  IMPRESSION: No acute abnormality.  No cervical spine fracture or dislocation. Moderate to severe multilevel cervical spondylosis.  Original Report Authenticated By: Andreas Newport, M.D.    I independently reviewed the above imaging studies.  Impression/Assessment:  Urinary retention/difficult Foley insertion secondary to atrophic vaginitis and some urethral stenosis.  Plan:  Leave Foley catheter indwelling the next 48-72 hours or until mobility is improved. We'll notify Dr. Vernie Ammons of the patient's admission. Treat acute cystitis is indicated.  Destony Prevost S 06/19/2012, 8:23 PM  Valetta Fuller, MD

## 2012-06-19 NOTE — Progress Notes (Signed)
Spoke with Dr. Randol Kern about ortho consult not in computer.  MD says he has called Dr. Sherlean Foot to come and see the patient and will be seen by ortho. Thomas Hoff

## 2012-06-19 NOTE — Progress Notes (Signed)
[facility]                                                                                Patient Demographics  [name], is a 78 y.o. female  QIO:[id number]  LKG:[id number]  DOB - 16-Jun-1922  Admit date - 06/18/2012  Admitting Physician Huey Bienenstock, MD  Outpatient Primary MD for the patient is [name], MD  LOS - 1   Chief Complaint  Patient presents with  . Fall        Assessment & Plan    Principal Problem:  *Syncope Active Problems:  HYPERLIPIDEMIA  HYPERTENSION  CORONARY ARTERY DISEASE  Memory loss  Hx of cardiac pacemaker, medtronic for tachy/brady syndrome  1. Syncope. No evidence on telemetry monitor, patient rhythm is paced, cardiology consult appreciated, normal EF on echo, no arrhythmias on pacemaker interrogation, could not check orthostasis secondary to right hip pain, we'll continue to hold antihypertensive medication, negative troponins x3, will check carotid Doppler, continue on fall precautions.   2. Acute right obturator ring fracture: Orthopedic Dr. Valentina Gu was consulted.  3. Anemia. Patient is status post fall with facial hematoma and bruising, hold subcutaneous heparin. Monitor H&H closely  4. Fever. Patient had mildly positive urinalysis yesterday, will start on Rocephin.  5. Coronary artery disease. No complaints of chest pain or shortness of breath continue with Plavix  6. Memory loss continue with Aricept Code Status: FULL   Family Communication: with patient and gradson  Disposition Plan: Home VS SNF    Procedures  Echo pacemaker interrogation   Consults  Cardiology /Kennedy    Antibiotics   Rocephin 7/26   Time Spent in minutes   35   DVT Prophylaxis SCD ELGERGAWY, DAWOOD M.D on 06/19/2012 at 12:53 PM  Between 7am to 7pm - Pager - [phone]  After 7pm go to [email or web address] - password [email or web address]  And look for the night coverage person covering for me after hours  [facility] Office  [phone]    Subjective:   Deari Sessler today has, No headache, No chest pain, No abdominal pain - No Nausea, No new weakness tingling or numbness, No Cough - SOB, c/o right hip pain.  Objective:   Filed Vitals:   06/18/12 2000 06/19/12 0401 06/19/12 0559 06/19/12 0602  BP: 110/62 150/74  111/60  Pulse: 65 78    Temp: 100.8 F (38.2 C) 100.2 F (37.9 C) 99.4 F (37.4 C)   TempSrc: Oral Oral    Resp: 16 18    Height:      Weight:      SpO2: 90% 91%      Wt Readings from Last 3 Encounters:  06/18/12 58.106 kg (128 lb 1.6 oz)  06/17/11 56.7 kg (125 lb)  03/13/11 59.194 kg (130 lb 8 oz)     Intake/Output Summary (Last 24 hours) at 06/19/12 1253 Last data filed at 06/19/12 0900  Gross per 24 hour  Intake      0 ml  Output    175 ml  Net   -175 ml    Exam 1. Generalelderly female lying in bed in NAD,  2. Normal affect and insight, Not Suicidal or  Homicidal, Awake Alert, Oriented X 3.  3. No F.N deficits, ALL C.Nerves Intact, Strength 5/5 all 4 extremities, Sensation intact all 4 extremities, Plantars down going.  4. Ears and Eyes appear Normal, Conjunctivae clear, right eye and he anisocoria . Moist Oral Mucosa.Right lateral orbital hematoma multiple facial bruises mainly in the left neck chain and surrounding ear area  5. Supple Neck, No JVD, No cervical lymphadenopathy appriciated, No Carotid Bruits.  6. Symmetrical Chest wall movement, Good air movement bilaterally, CTAB.  7. RRR, No Gallops, Rubs or Murmurs, No Parasternal Heave.  8. Positive Bowel Sounds, Abdomen Soft, Non tender, No organomegaly appriciated,No rebound -guarding or rigidity.  9. No Cyanosis, Normal Skin Turgor, No Skin Rash or Bruise.  10. Good muscle tone, joints appear normal , no effusions,  ROM decreased due to pain in right lower ext.     Data Review   CBC  Lab 06/19/12 0147 06/18/12 0937  WBC 9.0 15.5*  HGB 9.3* 12.5  HCT 27.1* 36.7  PLT 121* 153  MCV 92.2 92.4   MCH 31.6 31.5  MCHC 34.3 34.1  RDW 14.1 13.7  LYMPHSABS -- 1.6  MONOABS -- 0.9  EOSABS -- 0.1  BASOSABS -- 0.0  BANDABS -- --    Chemistries   Lab 06/19/12 0147 06/18/12 0937  NA 135 140  K 4.2 3.5  CL 99 100  CO2 27 31  GLUCOSE 107* 167*  BUN 32* 21  CREATININE 1.22* 0.88  CALCIUM 8.5 9.8  MG -- --  AST -- --  ALT -- --  ALKPHOS -- --  BILITOT -- --   ------------------------------------------------------------------------------------------------------------------ estimated creatinine clearance is 25.7 ml/min (by C-G formula based on Cr of 1.22). ------------------------------------------------------------------------------------------------------------------ No results found for this basename: HGBA1C:2 in the last 72 hours ------------------------------------------------------------------------------------------------------------------ No results found for this basename: CHOL:2,HDL:2,LDLCALC:2,TRIG:2,CHOLHDL:2,LDLDIRECT:2 in the last 72 hours ------------------------------------------------------------------------------------------------------------------ No results found for this basename: TSH,T4TOTAL,FREET3,T3FREE,THYROIDAB in the last 72 hours ------------------------------------------------------------------------------------------------------------------ No results found for this basename: VITAMINB12:2,FOLATE:2,FERRITIN:2,TIBC:2,IRON:2,RETICCTPCT:2 in the last 72 hours  Coagulation profile No results found for this basename: INR:5,PROTIME:5 in the last 168 hours  No results found for this basename: DDIMER:2 in the last 72 hours  Cardiac Enzymes  Lab 06/19/12 0147 06/18/12 1815 06/18/12 0950  CKMB -- -- 7.9*  TROPONINI <0.30 <0.30 <0.30  MYOGLOBIN -- -- --   ------------------------------------------------------------------------------------------------------------------ No components found with this basename: POCBNP:3  Micro Results No results found for  this or any previous visit (from the past 240 hour(s)).  Radiology Reports Dg Chest 1 View  06/18/2012  *RADIOLOGY REPORT*  Clinical Data: Hip/pelvic fracture, preoperative assessment  CHEST - 1 VIEW  Comparison: 01/21/2011  Findings: Left subclavian sequential transvenous pacemaker leads project at right atrium and right ventricle. Enlargement of cardiac silhouette. Calcified tortuous aorta. Pulmonary vascularity normal. Lungs clear. Question calcified granulomata projecting over right lung. No pleural effusion or pneumothorax. Compression fracture mid thoracic vertebra with evidence of prior spinal augmentation procedure.  IMPRESSION: Enlargement of cardiac silhouette post pacemaker. No acute abnormalities.  Original Report Authenticated By: Lollie Marrow, M.D.   Dg Hip Complete Right  06/18/2012  *RADIOLOGY REPORT*  Clinical Data: Fall.  Right hip pain.  RIGHT HIP - COMPLETE 2+ VIEW  Comparison: 06/05/2010.  Findings: There are new right parasymphyseal pubic fractures which appear acute, with sharply marginated edges.  Probable complimentary fracture involving the mid right inferior pubic ramus.  The sacral arcades appear intact.  The left obturator ring is intact. Iliac artery atherosclerosis.  Right total  hip arthroplasty is present with unchanged displacement of the lesser trochanter from old fracture.  IMPRESSION: Acute nondisplaced right obturator ring fractures.  No contralateral obturator ring or sacral fracture identified.  Original Report Authenticated By: Andreas Newport, M.D.   Ct Head Wo Contrast  06/18/2012  *RADIOLOGY REPORT*  Clinical Data:  Found on floor.  Unwitnessed fall.  Found down. Question loss of consciousness.  Right periorbital hematoma. Headache.  CT HEAD WITHOUT CONTRAST CT CERVICAL SPINE WITHOUT CONTRAST  Technique:  Multidetector CT imaging of the head and cervical spine was performed following the standard protocol without intravenous contrast.  Multiplanar CT image  reconstructions of the cervical spine were also generated.  Comparison:  CT head 06/18/2012.  CT HEAD  Findings: Multiple areas of face and scalp soft tissue swelling are present.  There is a soft tissue hematoma lateral to the right orbital rim measuring 21 mm x 13 mm (image 6 series 3).  Right ocular banding.  Bilateral lens extractions noted. There is no skull fracture.  Left parietal scalp soft tissue stranding compatible with contusion.  Stranding is present in the soft tissues overlying the left zygomatic arch, also compatible with contusion.  No mass lesion, mass effect, midline shift, hydrocephalus, hemorrhage.  No acute territorial cortical ischemia/infarct. Atrophy and chronic ischemic white matter disease is present. Left parietal and posterior temporal encephalomalacia are unchanged compared to prior, better seen on previous study.  IMPRESSION:  1.  No acute intracranial abnormality. 2.  Right periorbital subcutaneous hematoma. Marked swelling over the left zygomatic arch and into the left cheek extending to the left parotid is presumably post-traumatic and compatible with contusion. 3.  Small left parietal scalp contusion.  CT ORBITS WITHOUT CONTRAST  Technique:  Multidetector CT imaging of the orbits was performed following the standard protocol without intravenous contrast.  Findings:  There is no orbital fracture.  Marker overlies the right side of face.  Orbital floor and medial orbital wall appears intact.  Ocular changes as described above.  Right periorbital subcutaneous hematoma.  No retro bulb are or intraconal hematoma. Globes appear intact.  Marked soft tissue swelling is present in the left cheek extending to the left parotid, partially visualized. Presumably this is post-traumatic.  IMPRESSION:  Negative for orbital fracture.  CT CERVICAL SPINE  Findings: Reversal of the normal cervical lordosis is present. Apex of the reversal is at C5-C6.  There is no cervical spine fracture or  dislocation.  Severe facet arthrosis and degenerative disc disease is present most pronounced at C4-C5 through C5-C6. Craniocervical alignment is within normal limits.  Odontoid is intact.  Mastoid air cells appear within normal limits.  The degenerative changes with mild thickening of the transverse ligament of the atlas and calcification.  Carotid atherosclerosis. 3 mm anterolisthesis of C4 ounce C5 appears degenerative, associated with facet arthrosis.  Mild to moderate bony central stenosis at C4-C5 and C5-C6.  Multilevel foraminal stenosis, generally worse on the left.  IMPRESSION: No acute abnormality.  No cervical spine fracture or dislocation. Moderate to severe multilevel cervical spondylosis.  Original Report Authenticated By: Andreas Newport, M.D.   Ct Cervical Spine Wo Contrast  06/18/2012  *RADIOLOGY REPORT*  Clinical Data:  Found on floor.  Unwitnessed fall.  Found down. Question loss of consciousness.  Right periorbital hematoma. Headache.  CT HEAD WITHOUT CONTRAST CT CERVICAL SPINE WITHOUT CONTRAST  Technique:  Multidetector CT imaging of the head and cervical spine was performed following the standard protocol without intravenous contrast.  Multiplanar CT image  reconstructions of the cervical spine were also generated.  Comparison:  CT head 06/18/2012.  CT HEAD  Findings: Multiple areas of face and scalp soft tissue swelling are present.  There is a soft tissue hematoma lateral to the right orbital rim measuring 21 mm x 13 mm (image 6 series 3).  Right ocular banding.  Bilateral lens extractions noted. There is no skull fracture.  Left parietal scalp soft tissue stranding compatible with contusion.  Stranding is present in the soft tissues overlying the left zygomatic arch, also compatible with contusion.  No mass lesion, mass effect, midline shift, hydrocephalus, hemorrhage.  No acute territorial cortical ischemia/infarct. Atrophy and chronic ischemic white matter disease is present. Left  parietal and posterior temporal encephalomalacia are unchanged compared to prior, better seen on previous study.  IMPRESSION:  1.  No acute intracranial abnormality. 2.  Right periorbital subcutaneous hematoma. Marked swelling over the left zygomatic arch and into the left cheek extending to the left parotid is presumably post-traumatic and compatible with contusion. 3.  Small left parietal scalp contusion.  CT ORBITS WITHOUT CONTRAST  Technique:  Multidetector CT imaging of the orbits was performed following the standard protocol without intravenous contrast.  Findings:  There is no orbital fracture.  Marker overlies the right side of face.  Orbital floor and medial orbital wall appears intact.  Ocular changes as described above.  Right periorbital subcutaneous hematoma.  No retro bulb are or intraconal hematoma. Globes appear intact.  Marked soft tissue swelling is present in the left cheek extending to the left parotid, partially visualized. Presumably this is post-traumatic.  IMPRESSION:  Negative for orbital fracture.  CT CERVICAL SPINE  Findings: Reversal of the normal cervical lordosis is present. Apex of the reversal is at C5-C6.  There is no cervical spine fracture or dislocation.  Severe facet arthrosis and degenerative disc disease is present most pronounced at C4-C5 through C5-C6. Craniocervical alignment is within normal limits.  Odontoid is intact.  Mastoid air cells appear within normal limits.  The degenerative changes with mild thickening of the transverse ligament of the atlas and calcification.  Carotid atherosclerosis. 3 mm anterolisthesis of C4 ounce C5 appears degenerative, associated with facet arthrosis.  Mild to moderate bony central stenosis at C4-C5 and C5-C6.  Multilevel foraminal stenosis, generally worse on the left.  IMPRESSION: No acute abnormality.  No cervical spine fracture or dislocation. Moderate to severe multilevel cervical spondylosis.  Original Report Authenticated By:  Andreas Newport, M.D.   Ct Maxillofacial Wo Cm  06/18/2012  *RADIOLOGY REPORT*  Clinical Data: Fall, left sided face bruising, right eye swollen shut.  CT MAXILLOFACIAL WITHOUT CONTRAST  Technique:  Multidetector CT imaging of the maxillofacial structures was performed. Multiplanar CT image reconstructions were also generated.  Comparison: Head CT same day  Findings: There is a small hematoma lateral to the right orbit and superior to the ygomatic arch (image 65, series 3).  There is a large hematoma within the left muscles of mastication lateral the angled jaw measuring 2.5 cm in thickness compared to the normal right side which measures 1.3 cm in thickness.  There is subcutaneus stranding and edema in the soft tissues of the left face adjacent to the mandible.  There is some stranding in the parapharyngeal space on the left additionally.  No evidence of orbital fracture.  The zygoma are intact.  No evidence of maxillary fracture or pterygoid plate fracture.  No fluid in the paranasal sinuses are frontal sinus.  Nasal bones are intact.  The mandibular condyles are located.  No evidence of mandible fracture.  IMPRESSION:  1.  A large hematoma within the left muscles of mastication. 2.  Extensive subcutaneous edema and likely hemorrhage within the soft tissues of the left face adjacent to of the angle jaw extending into the parapharyngeal space. 3.  No evidence of facial fracture. 4.  Smaller left hematoma lateral to the right lateral orbit.  Original Report Authenticated By: Genevive Bi, M.D.   Ct Orbitss W/o Cm  06/18/2012  *RADIOLOGY REPORT*  Clinical Data:  Found on floor.  Unwitnessed fall.  Found down. Question loss of consciousness.  Right periorbital hematoma. Headache.  CT HEAD WITHOUT CONTRAST CT CERVICAL SPINE WITHOUT CONTRAST  Technique:  Multidetector CT imaging of the head and cervical spine was performed following the standard protocol without intravenous contrast.  Multiplanar CT image  reconstructions of the cervical spine were also generated.  Comparison:  CT head 06/18/2012.  CT HEAD  Findings: Multiple areas of face and scalp soft tissue swelling are present.  There is a soft tissue hematoma lateral to the right orbital rim measuring 21 mm x 13 mm (image 6 series 3).  Right ocular banding.  Bilateral lens extractions noted. There is no skull fracture.  Left parietal scalp soft tissue stranding compatible with contusion.  Stranding is present in the soft tissues overlying the left zygomatic arch, also compatible with contusion.  No mass lesion, mass effect, midline shift, hydrocephalus, hemorrhage.  No acute territorial cortical ischemia/infarct. Atrophy and chronic ischemic white matter disease is present. Left parietal and posterior temporal encephalomalacia are unchanged compared to prior, better seen on previous study.  IMPRESSION:  1.  No acute intracranial abnormality. 2.  Right periorbital subcutaneous hematoma. Marked swelling over the left zygomatic arch and into the left cheek extending to the left parotid is presumably post-traumatic and compatible with contusion. 3.  Small left parietal scalp contusion.  CT ORBITS WITHOUT CONTRAST  Technique:  Multidetector CT imaging of the orbits was performed following the standard protocol without intravenous contrast.  Findings:  There is no orbital fracture.  Marker overlies the right side of face.  Orbital floor and medial orbital wall appears intact.  Ocular changes as described above.  Right periorbital subcutaneous hematoma.  No retro bulb are or intraconal hematoma. Globes appear intact.  Marked soft tissue swelling is present in the left cheek extending to the left parotid, partially visualized. Presumably this is post-traumatic.  IMPRESSION:  Negative for orbital fracture.  CT CERVICAL SPINE  Findings: Reversal of the normal cervical lordosis is present. Apex of the reversal is at C5-C6.  There is no cervical spine fracture or  dislocation.  Severe facet arthrosis and degenerative disc disease is present most pronounced at C4-C5 through C5-C6. Craniocervical alignment is within normal limits.  Odontoid is intact.  Mastoid air cells appear within normal limits.  The degenerative changes with mild thickening of the transverse ligament of the atlas and calcification.  Carotid atherosclerosis. 3 mm anterolisthesis of C4 ounce C5 appears degenerative, associated with facet arthrosis.  Mild to moderate bony central stenosis at C4-C5 and C5-C6.  Multilevel foraminal stenosis, generally worse on the left.  IMPRESSION: No acute abnormality.  No cervical spine fracture or dislocation. Moderate to severe multilevel cervical spondylosis.  Original Report Authenticated By: Andreas Newport, M.D.    Scheduled Meds:   . celecoxib  200 mg Oral Daily  . clopidogrel  75 mg Oral Daily  . docusate sodium  100 mg Oral  BID  . donepezil  5 mg Oral QHS  . fesoterodine  4 mg Oral Daily  . heparin  5,000 Units Subcutaneous Q8H  . mirabegron ER  25 mg Oral QHS  . sodium chloride  3 mL Intravenous Q12H  . timolol  1 drop Both Eyes QHS  . Vitamin D (Ergocalciferol)  50,000 Units Oral Q30 days  . DISCONTD: timolol  1 drop Both Eyes Daily  . DISCONTD: Vitamin D3  1 capsule Oral Q30 days   Continuous Infusions:   . sodium chloride 50 mL/hr (06/19/12 0958)   PRN Meds:.alum & mag hydroxide-simeth, bisacodyl, HYDROcodone-acetaminophen, ondansetron (ZOFRAN) IV, ondansetron

## 2012-06-19 NOTE — Clinical Documentation Improvement (Signed)
Anemia Blood Loss Clarification  THIS DOCUMENT IS NOT A PERMANENT PART OF THE MEDICAL RECORD  RESPOND TO THE THIS QUERY, FOLLOW THE INSTRUCTIONS BELOW:  1. If needed, update documentation for the patient's encounter via the notes activity.  2. Access this query again and click edit on the In [employer].  3. After updating, or not, click F2 to complete all highlighted (required) fields concerning your review. Select "additional documentation in the medical record" OR "no additional documentation provided".  4. Click Sign note button.  5. The deficiency will fall out of your In Basket *Please let us know if you are not able to complete this workflow by phone or e-mail (listed below).        06/19/12  Dear Dr.Elgergawy Marton Redwood  In an effort to better capture your patient's severity of illness, reflect appropriate length of stay and utilization of resources, a review of the patient medical record has revealed the following indicators.    Based on your clinical judgment, please clarify and document in a progress note and/or discharge summary the clinical condition associated with the following supporting information:  In responding to this query please exercise your independent judgment.  The fact that a query is asked, does not imply that any particular answer is desired or expected.  Possible Clinical Conditions?   " Acute Blood Loss Anemia  " Precipitous drop in Hematocrit  " Other Condition  " Cannot Clinically Determine    Supporting Information: Drop in H/H and bruising noted per 7/26 progress notes.  Diagnostics: H&H on 7/26:   9.3/27.1 H&H on 7/25:  12.5/36.7  Reviewed: Additional documentation provided per 7/30 progress notes.                                                                   Thank You,  Marciano Sequin,  Clinical Documentation Specialist:  Pager: [phone]  Health Information Management [place]

## 2012-06-19 NOTE — Progress Notes (Signed)
Pt has been placed on fracture pan frequently with small urine output.  Bladder scan performed and showed 857 ml.  MD called and order given for foley cath for retention. [name]

## 2012-06-20 DIAGNOSIS — R339 Retention of urine, unspecified: Secondary | ICD-10-CM | POA: Diagnosis not present

## 2012-06-20 DIAGNOSIS — D649 Anemia, unspecified: Secondary | ICD-10-CM

## 2012-06-20 LAB — CBC
HCT: 25.2 % — ABNORMAL LOW (ref 36.0–46.0)
MCH: 31.5 pg (ref 26.0–34.0)
MCHC: 34.1 g/dL (ref 30.0–36.0)
MCV: 92.3 fL (ref 78.0–100.0)
Platelets: 91 10*3/uL — ABNORMAL LOW (ref 150–400)
RDW: 13.9 % (ref 11.5–15.5)

## 2012-06-20 LAB — BASIC METABOLIC PANEL
BUN: 21 mg/dL (ref 6–23)
Calcium: 8.1 mg/dL — ABNORMAL LOW (ref 8.4–10.5)
Creatinine, Ser: 0.76 mg/dL (ref 0.50–1.10)
GFR calc Af Amer: 85 mL/min — ABNORMAL LOW (ref >90)
GFR calc non Af Amer: 74 mL/min — ABNORMAL LOW (ref >90)

## 2012-06-20 MED ORDER — POLYETHYLENE GLYCOL 3350 17 G PO PACK
17.0000 g | PACK | Freq: Every day | ORAL | Status: DC
  Administered 2012-06-20 – 2012-06-22 (×2): 17 g via ORAL
  Filled 2012-06-20 (×4): qty 1

## 2012-06-20 MED ORDER — TRAZODONE HCL 100 MG PO TABS
100.0000 mg | ORAL_TABLET | Freq: Once | ORAL | Status: AC
  Administered 2012-06-21: 100 mg via ORAL
  Filled 2012-06-20: qty 1

## 2012-06-20 MED ORDER — ENSURE PUDDING PO PUDG
1.0000 | Freq: Three times a day (TID) | ORAL | Status: DC
  Administered 2012-06-20 – 2012-06-23 (×7): 1 via ORAL

## 2012-06-20 NOTE — Progress Notes (Addendum)
[facility]                                                                                Patient Demographics  [name], is a 78 y.o. female  ZOX:[id number]  [id number]  DOB - 03-28-1922  Admit date - 06/18/2012  Admitting Physician Huey Bienenstock, MD  Outpatient Primary MD for the patient is [name], MD  LOS - 2   Chief Complaint  Patient presents with  . Fall        Assessment & Plan    Principal Problem:  *Syncope Active Problems:  HYPERLIPIDEMIA  HYPERTENSION  CORONARY ARTERY DISEASE  Memory loss  Hx of cardiac pacemaker, medtronic for tachy/brady syndrome  Anemia  Pelvic fracture  1. Syncope. No evidence on telemetry monitor, patient rhythm is paced, cardiology consult appreciated, normal EF on echo, no arrhythmias on pacemaker interrogation, could not check orthostasis secondary to right hip pain, we'll continue to hold antihypertensive medication, negative troponins x3, will check carotid Doppler, continue on fall precautions.   2. Acute right obturator ring fracture: Orthopedic Dr. Valentina Gu was consulted,will consult PT, weight bearing as tolerated.  3. Anemia. Patient is status post fall with significant facial hematoma and bruising, hold subcutaneous heparin.will hold plavix, Monitor H&H closely, has hemoglobin continues to trend down, will check Hemoccult, but this is most likely delusional from her IV fluid as patient has drop in white blood cell and platelet count as well.  4.UTI. Continue with Rocephin, follow urine culture.  5. Coronary artery disease. No complaints of chest pain or shortness of breath continue with Plavix.  6. Memory loss continue with Aricept  7. Urinary retention. Patient was hard  to insert Foley, inserted by urology DR Manon Hilding Code Status: FULL   Family Communication: with patient and gradson  Disposition Plan: Home VS SNF    Procedures  Echo pacemaker interrogation   Consults    Cardiology Corinda Gubler  Urology/Dr Grapey Orthopedic/ Dr Valentina Gu PT consult   Antibiotics   Rocephin 7/26   Time Spent in minutes   35   DVT Prophylaxis SCD Dylanie Quesenberry M.D on 06/20/2012 at 9:40 AM  Between 7am to 7pm - Pager - [phone]  After 7pm go to [email or web address] - password [email or web address]  And look for the night coverage person covering for me after hours  [facility] Office  [phone]    Subjective:   [name] today has, No headache, No chest pain, No abdominal pain - No Nausea, No new weakness tingling or numbness, No Cough - SOB, c/o right hip pain.  Objective:   Filed Vitals:   06/19/12 0602 06/19/12 1352 06/19/12 2133 06/20/12 0312  BP: 111/60 124/47 150/55 156/65  Pulse:  59 71 65  Temp:  98.4 F (36.9 C) 98.8 F (37.1 C) 99.2 F (37.3 C)  TempSrc:  Oral Oral Oral  Resp:  18 18 18   Height:      Weight:      SpO2:  98% 90% 99%    Wt Readings from Last 3 Encounters:  06/18/12 58.106 kg (128 lb 1.6 oz)  06/17/11 56.7 kg (125 lb)  03/13/11 59.194 kg (130 lb 8 oz)  Intake/Output Summary (Last 24 hours) at 06/20/12 0940 Last data filed at 06/20/12 0313  Gross per 24 hour  Intake      0 ml  Output   2000 ml  Net  -2000 ml    Exam 1. Generalelderly female lying in bed in NAD,  2. Normal affect and insight, Not Suicidal or Homicidal, Awake Alert, Oriented X 3.  3. No F.N deficits, ALL C.Nerves Intact, Strength 5/5 all 4 extremities, Sensation intact all 4 extremities, Plantars down going.  4. Ears and Eyes appear Normal, Conjunctivae clear, right eye and he anisocoria . Moist Oral Mucosa.Right lateral orbital hematoma multiple facial bruises mainly in the left neck chain and surrounding ear area  5. Supple Neck, No JVD, No cervical lymphadenopathy appriciated, No Carotid Bruits.  6. Symmetrical Chest wall movement, Good air movement bilaterally, CTAB.  7. RRR, No Gallops, Rubs or Murmurs, No Parasternal Heave.  8. Positive Bowel  Sounds, Abdomen Soft, Non tender, No organomegaly appriciated,No rebound -guarding or rigidity.  9. No Cyanosis, Normal Skin Turgor, No Skin Rash or Bruise.  10. Good muscle tone, joints appear normal , no effusions,  ROM decreased due to pain in right lower ext.     Data Review   CBC  Lab 06/20/12 0505 06/19/12 0147 06/18/12 0937  WBC 6.8 9.0 15.5*  HGB 8.6* 9.3* 12.5  HCT 25.2* 27.1* 36.7  PLT 91* 121* 153  MCV 92.3 92.2 92.4  MCH 31.5 31.6 31.5  MCHC 34.1 34.3 34.1  RDW 13.9 14.1 13.7  LYMPHSABS -- -- 1.6  MONOABS -- -- 0.9  EOSABS -- -- 0.1  BASOSABS -- -- 0.0  BANDABS -- -- --    Chemistries   Lab 06/20/12 0505 06/19/12 0147 06/18/12 0937  NA 137 135 140  K 3.7 4.2 3.5  CL 102 99 100  CO2 29 27 31   GLUCOSE 116* 107* 167*  BUN 21 32* 21  CREATININE 0.76 1.22* 0.88  CALCIUM 8.1* 8.5 9.8  MG -- -- --  AST -- -- --  ALT -- -- --  ALKPHOS -- -- --  BILITOT -- -- --   ------------------------------------------------------------------------------------------------------------------ estimated creatinine clearance is 39.2 ml/min (by C-G formula based on Cr of 0.76). ------------------------------------------------------------------------------------------------------------------ No results found for this basename: HGBA1C:2 in the last 72 hours ------------------------------------------------------------------------------------------------------------------ No results found for this basename: CHOL:2,HDL:2,LDLCALC:2,TRIG:2,CHOLHDL:2,LDLDIRECT:2 in the last 72 hours ------------------------------------------------------------------------------------------------------------------ No results found for this basename: TSH,T4TOTAL,FREET3,T3FREE,THYROIDAB in the last 72 hours ------------------------------------------------------------------------------------------------------------------ No results found for this basename:  VITAMINB12:2,FOLATE:2,FERRITIN:2,TIBC:2,IRON:2,RETICCTPCT:2 in the last 72 hours  Coagulation profile No results found for this basename: INR:5,PROTIME:5 in the last 168 hours  No results found for this basename: DDIMER:2 in the last 72 hours  Cardiac Enzymes  Lab 06/19/12 0147 06/18/12 1815 06/18/12 0950  CKMB -- -- 7.9*  TROPONINI <0.30 <0.30 <0.30  MYOGLOBIN -- -- --   ------------------------------------------------------------------------------------------------------------------ No components found with this basename: POCBNP:3  Micro Results No results found for this or any previous visit (from the past 240 hour(s)).  Radiology Reports Dg Chest 1 View  06/18/2012  *RADIOLOGY REPORT*  Clinical Data: Hip/pelvic fracture, preoperative assessment  CHEST - 1 VIEW  Comparison: 01/21/2011  Findings: Left subclavian sequential transvenous pacemaker leads project at right atrium and right ventricle. Enlargement of cardiac silhouette. Calcified tortuous aorta. Pulmonary vascularity normal. Lungs clear. Question calcified granulomata projecting over right lung. No pleural effusion or pneumothorax. Compression fracture mid thoracic vertebra with evidence of prior spinal augmentation procedure.  IMPRESSION: Enlargement of cardiac  silhouette post pacemaker. No acute abnormalities.  Original Report Authenticated By: Lollie Marrow, M.D.   Dg Hip Complete Right  06/18/2012  *RADIOLOGY REPORT*  Clinical Data: Fall.  Right hip pain.  RIGHT HIP - COMPLETE 2+ VIEW  Comparison: 06/05/2010.  Findings: There are new right parasymphyseal pubic fractures which appear acute, with sharply marginated edges.  Probable complimentary fracture involving the mid right inferior pubic ramus.  The sacral arcades appear intact.  The left obturator ring is intact. Iliac artery atherosclerosis.  Right total hip arthroplasty is present with unchanged displacement of the lesser trochanter from old fracture.  IMPRESSION: Acute  nondisplaced right obturator ring fractures.  No contralateral obturator ring or sacral fracture identified.  Original Report Authenticated By: Andreas Newport, M.D.   Ct Head Wo Contrast  06/18/2012  *RADIOLOGY REPORT*  Clinical Data:  Found on floor.  Unwitnessed fall.  Found down. Question loss of consciousness.  Right periorbital hematoma. Headache.  CT HEAD WITHOUT CONTRAST CT CERVICAL SPINE WITHOUT CONTRAST  Technique:  Multidetector CT imaging of the head and cervical spine was performed following the standard protocol without intravenous contrast.  Multiplanar CT image reconstructions of the cervical spine were also generated.  Comparison:  CT head 06/18/2012.  CT HEAD  Findings: Multiple areas of face and scalp soft tissue swelling are present.  There is a soft tissue hematoma lateral to the right orbital rim measuring 21 mm x 13 mm (image 6 series 3).  Right ocular banding.  Bilateral lens extractions noted. There is no skull fracture.  Left parietal scalp soft tissue stranding compatible with contusion.  Stranding is present in the soft tissues overlying the left zygomatic arch, also compatible with contusion.  No mass lesion, mass effect, midline shift, hydrocephalus, hemorrhage.  No acute territorial cortical ischemia/infarct. Atrophy and chronic ischemic white matter disease is present. Left parietal and posterior temporal encephalomalacia are unchanged compared to prior, better seen on previous study.  IMPRESSION:  1.  No acute intracranial abnormality. 2.  Right periorbital subcutaneous hematoma. Marked swelling over the left zygomatic arch and into the left cheek extending to the left parotid is presumably post-traumatic and compatible with contusion. 3.  Small left parietal scalp contusion.  CT ORBITS WITHOUT CONTRAST  Technique:  Multidetector CT imaging of the orbits was performed following the standard protocol without intravenous contrast.  Findings:  There is no orbital fracture.  Marker  overlies the right side of face.  Orbital floor and medial orbital wall appears intact.  Ocular changes as described above.  Right periorbital subcutaneous hematoma.  No retro bulb are or intraconal hematoma. Globes appear intact.  Marked soft tissue swelling is present in the left cheek extending to the left parotid, partially visualized. Presumably this is post-traumatic.  IMPRESSION:  Negative for orbital fracture.  CT CERVICAL SPINE  Findings: Reversal of the normal cervical lordosis is present. Apex of the reversal is at C5-C6.  There is no cervical spine fracture or dislocation.  Severe facet arthrosis and degenerative disc disease is present most pronounced at C4-C5 through C5-C6. Craniocervical alignment is within normal limits.  Odontoid is intact.  Mastoid air cells appear within normal limits.  The degenerative changes with mild thickening of the transverse ligament of the atlas and calcification.  Carotid atherosclerosis. 3 mm anterolisthesis of C4 ounce C5 appears degenerative, associated with facet arthrosis.  Mild to moderate bony central stenosis at C4-C5 and C5-C6.  Multilevel foraminal stenosis, generally worse on the left.  IMPRESSION: No acute abnormality.  No cervical spine fracture or dislocation. Moderate to severe multilevel cervical spondylosis.  Original Report Authenticated By: Andreas Newport, M.D.   Ct Cervical Spine Wo Contrast  06/18/2012  *RADIOLOGY REPORT*  Clinical Data:  Found on floor.  Unwitnessed fall.  Found down. Question loss of consciousness.  Right periorbital hematoma. Headache.  CT HEAD WITHOUT CONTRAST CT CERVICAL SPINE WITHOUT CONTRAST  Technique:  Multidetector CT imaging of the head and cervical spine was performed following the standard protocol without intravenous contrast.  Multiplanar CT image reconstructions of the cervical spine were also generated.  Comparison:  CT head 06/18/2012.  CT HEAD  Findings: Multiple areas of face and scalp soft tissue swelling are  present.  There is a soft tissue hematoma lateral to the right orbital rim measuring 21 mm x 13 mm (image 6 series 3).  Right ocular banding.  Bilateral lens extractions noted. There is no skull fracture.  Left parietal scalp soft tissue stranding compatible with contusion.  Stranding is present in the soft tissues overlying the left zygomatic arch, also compatible with contusion.  No mass lesion, mass effect, midline shift, hydrocephalus, hemorrhage.  No acute territorial cortical ischemia/infarct. Atrophy and chronic ischemic white matter disease is present. Left parietal and posterior temporal encephalomalacia are unchanged compared to prior, better seen on previous study.  IMPRESSION:  1.  No acute intracranial abnormality. 2.  Right periorbital subcutaneous hematoma. Marked swelling over the left zygomatic arch and into the left cheek extending to the left parotid is presumably post-traumatic and compatible with contusion. 3.  Small left parietal scalp contusion.  CT ORBITS WITHOUT CONTRAST  Technique:  Multidetector CT imaging of the orbits was performed following the standard protocol without intravenous contrast.  Findings:  There is no orbital fracture.  Marker overlies the right side of face.  Orbital floor and medial orbital wall appears intact.  Ocular changes as described above.  Right periorbital subcutaneous hematoma.  No retro bulb are or intraconal hematoma. Globes appear intact.  Marked soft tissue swelling is present in the left cheek extending to the left parotid, partially visualized. Presumably this is post-traumatic.  IMPRESSION:  Negative for orbital fracture.  CT CERVICAL SPINE  Findings: Reversal of the normal cervical lordosis is present. Apex of the reversal is at C5-C6.  There is no cervical spine fracture or dislocation.  Severe facet arthrosis and degenerative disc disease is present most pronounced at C4-C5 through C5-C6. Craniocervical alignment is within normal limits.  Odontoid is  intact.  Mastoid air cells appear within normal limits.  The degenerative changes with mild thickening of the transverse ligament of the atlas and calcification.  Carotid atherosclerosis. 3 mm anterolisthesis of C4 ounce C5 appears degenerative, associated with facet arthrosis.  Mild to moderate bony central stenosis at C4-C5 and C5-C6.  Multilevel foraminal stenosis, generally worse on the left.  IMPRESSION: No acute abnormality.  No cervical spine fracture or dislocation. Moderate to severe multilevel cervical spondylosis.  Original Report Authenticated By: Andreas Newport, M.D.   Ct Maxillofacial Wo Cm  06/18/2012  *RADIOLOGY REPORT*  Clinical Data: Fall, left sided face bruising, right eye swollen shut.  CT MAXILLOFACIAL WITHOUT CONTRAST  Technique:  Multidetector CT imaging of the maxillofacial structures was performed. Multiplanar CT image reconstructions were also generated.  Comparison: Head CT same day  Findings: There is a small hematoma lateral to the right orbit and superior to the ygomatic arch (image 65, series 3).  There is a large hematoma within the left muscles of mastication  lateral the angled jaw measuring 2.5 cm in thickness compared to the normal right side which measures 1.3 cm in thickness.  There is subcutaneus stranding and edema in the soft tissues of the left face adjacent to the mandible.  There is some stranding in the parapharyngeal space on the left additionally.  No evidence of orbital fracture.  The zygoma are intact.  No evidence of maxillary fracture or pterygoid plate fracture.  No fluid in the paranasal sinuses are frontal sinus.  Nasal bones are intact.  The mandibular condyles are located.  No evidence of mandible fracture.  IMPRESSION:  1.  A large hematoma within the left muscles of mastication. 2.  Extensive subcutaneous edema and likely hemorrhage within the soft tissues of the left face adjacent to of the angle jaw extending into the parapharyngeal space. 3.  No  evidence of facial fracture. 4.  Smaller left hematoma lateral to the right lateral orbit.  Original Report Authenticated By: Genevive Bi, M.D.   Ct Orbitss W/o Cm  06/18/2012  *RADIOLOGY REPORT*  Clinical Data:  Found on floor.  Unwitnessed fall.  Found down. Question loss of consciousness.  Right periorbital hematoma. Headache.  CT HEAD WITHOUT CONTRAST CT CERVICAL SPINE WITHOUT CONTRAST  Technique:  Multidetector CT imaging of the head and cervical spine was performed following the standard protocol without intravenous contrast.  Multiplanar CT image reconstructions of the cervical spine were also generated.  Comparison:  CT head 06/18/2012.  CT HEAD  Findings: Multiple areas of face and scalp soft tissue swelling are present.  There is a soft tissue hematoma lateral to the right orbital rim measuring 21 mm x 13 mm (image 6 series 3).  Right ocular banding.  Bilateral lens extractions noted. There is no skull fracture.  Left parietal scalp soft tissue stranding compatible with contusion.  Stranding is present in the soft tissues overlying the left zygomatic arch, also compatible with contusion.  No mass lesion, mass effect, midline shift, hydrocephalus, hemorrhage.  No acute territorial cortical ischemia/infarct. Atrophy and chronic ischemic white matter disease is present. Left parietal and posterior temporal encephalomalacia are unchanged compared to prior, better seen on previous study.  IMPRESSION:  1.  No acute intracranial abnormality. 2.  Right periorbital subcutaneous hematoma. Marked swelling over the left zygomatic arch and into the left cheek extending to the left parotid is presumably post-traumatic and compatible with contusion. 3.  Small left parietal scalp contusion.  CT ORBITS WITHOUT CONTRAST  Technique:  Multidetector CT imaging of the orbits was performed following the standard protocol without intravenous contrast.  Findings:  There is no orbital fracture.  Marker overlies the right  side of face.  Orbital floor and medial orbital wall appears intact.  Ocular changes as described above.  Right periorbital subcutaneous hematoma.  No retro bulb are or intraconal hematoma. Globes appear intact.  Marked soft tissue swelling is present in the left cheek extending to the left parotid, partially visualized. Presumably this is post-traumatic.  IMPRESSION:  Negative for orbital fracture.  CT CERVICAL SPINE  Findings: Reversal of the normal cervical lordosis is present. Apex of the reversal is at C5-C6.  There is no cervical spine fracture or dislocation.  Severe facet arthrosis and degenerative disc disease is present most pronounced at C4-C5 through C5-C6. Craniocervical alignment is within normal limits.  Odontoid is intact.  Mastoid air cells appear within normal limits.  The degenerative changes with mild thickening of the transverse ligament of the atlas and calcification.  Carotid  atherosclerosis. 3 mm anterolisthesis of C4 ounce C5 appears degenerative, associated with facet arthrosis.  Mild to moderate bony central stenosis at C4-C5 and C5-C6.  Multilevel foraminal stenosis, generally worse on the left.  IMPRESSION: No acute abnormality.  No cervical spine fracture or dislocation. Moderate to severe multilevel cervical spondylosis.  Original Report Authenticated By: Andreas Newport, M.D.    Scheduled Meds:    . cefTRIAXone (ROCEPHIN)  IV  1 g Intravenous Q24H  . clopidogrel  75 mg Oral Daily  . docusate sodium  100 mg Oral BID  . donepezil  5 mg Oral QHS  . fesoterodine  4 mg Oral Daily  . mirabegron ER  25 mg Oral QHS  . pantoprazole  40 mg Oral Daily  . sodium chloride  3 mL Intravenous Q12H  . timolol  1 drop Both Eyes QHS  . Vitamin D (Ergocalciferol)  50,000 Units Oral Q30 days  . DISCONTD: celecoxib  200 mg Oral Daily  . DISCONTD: heparin  5,000 Units Subcutaneous Q8H  . DISCONTD: timolol  1 drop Both Eyes Daily   Continuous Infusions:    . sodium chloride 50 mL/hr  (06/19/12 0958)   PRN Meds:.alum & mag hydroxide-simeth, bisacodyl, HYDROcodone-acetaminophen, ondansetron (ZOFRAN) IV, ondansetron

## 2012-06-20 NOTE — Consult Note (Signed)
Reason for Consult:Right hip pain status post hip pain Referring Physician: Jewel Mcafee is an 78 y.o. female.  HPI: 78 yo WF s/p R Total Hip by my former partner Dr.Rendall.  She fell and now has bruising and pain in the pelvic/hip area.  Past Medical History  Diagnosis Date  . CAD (coronary artery disease)   . Headache   . Hyperlipidemia   . Hypertension   . Osteoporosis   . Pacemaker   . Hx of cardiac pacemaker, medtronic for tachy/brady syndrome 06/19/2012    Past Surgical History  Procedure Date  . Total hip arthroplasty   . Orif hip fracture   . Lumbar laminectomy   . Lumbar fusion   . Cardiac pacemaker placement   . Coronary angioplasty with stent placement   . Tonsillectomy     Family History  Problem Relation Age of Onset  . Arthritis Other   . Hypertension Other     Social History:  reports that she quit smoking about 52 years ago. She has never used smokeless tobacco. She reports that she does not drink alcohol or use illicit drugs.  Allergies:  Allergies  Allergen Reactions  . Barbiturates Other (See Comments)    unknown  . Nembutal (Pentobarbital Sodium) Other (See Comments)    unknown  . Atropine Rash    Medications: I have reviewed the patient's current medications.  Results for orders placed during the hospital encounter of 06/18/12 (from the past 48 hour(s))  TROPONIN I     Status: Normal   Collection Time   06/18/12  6:15 PM      Component Value Range Comment   Troponin I <0.30  <0.30 ng/mL   TROPONIN I     Status: Normal   Collection Time   06/19/12  1:47 AM      Component Value Range Comment   Troponin I <0.30  <0.30 ng/mL   CBC     Status: Abnormal   Collection Time   06/19/12  1:47 AM      Component Value Range Comment   WBC 9.0  4.0 - 10.5 K/uL    RBC 2.94 (*) 3.87 - 5.11 MIL/uL    Hemoglobin 9.3 (*) 12.0 - 15.0 g/dL DELTA CHECK NOTED   HCT 27.1 (*) 36.0 - 46.0 %    MCV 92.2  78.0 - 100.0 fL    MCH 31.6  26.0 - 34.0 pg    MCHC 34.3  30.0 - 36.0 g/dL    RDW 16.1  09.6 - 04.5 %    Platelets 121 (*) 150 - 400 K/uL   BASIC METABOLIC PANEL     Status: Abnormal   Collection Time   06/19/12  1:47 AM      Component Value Range Comment   Sodium 135  135 - 145 mEq/L    Potassium 4.2  3.5 - 5.1 mEq/L    Chloride 99  96 - 112 mEq/L    CO2 27  19 - 32 mEq/L    Glucose, Bld 107 (*) 70 - 99 mg/dL    BUN 32 (*) 6 - 23 mg/dL DELTA CHECK NOTED   Creatinine, Ser 1.22 (*) 0.50 - 1.10 mg/dL    Calcium 8.5  8.4 - 40.9 mg/dL    GFR calc non Af Amer 39 (*) >90 mL/min    GFR calc Af Amer 45 (*) >90 mL/min   CBC     Status: Abnormal   Collection Time   06/20/12  5:05 AM      Component Value Range Comment   WBC 6.8  4.0 - 10.5 K/uL    RBC 2.73 (*) 3.87 - 5.11 MIL/uL    Hemoglobin 8.6 (*) 12.0 - 15.0 g/dL    HCT 16.1 (*) 09.6 - 46.0 %    MCV 92.3  78.0 - 100.0 fL    MCH 31.5  26.0 - 34.0 pg    MCHC 34.1  30.0 - 36.0 g/dL    RDW 04.5  40.9 - 81.1 %    Platelets 91 (*) 150 - 400 K/uL   BASIC METABOLIC PANEL     Status: Abnormal   Collection Time   06/20/12  5:05 AM      Component Value Range Comment   Sodium 137  135 - 145 mEq/L    Potassium 3.7  3.5 - 5.1 mEq/L    Chloride 102  96 - 112 mEq/L    CO2 29  19 - 32 mEq/L    Glucose, Bld 116 (*) 70 - 99 mg/dL    BUN 21  6 - 23 mg/dL DELTA CHECK NOTED   Creatinine, Ser 0.76  0.50 - 1.10 mg/dL DELTA CHECK NOTED   Calcium 8.1 (*) 8.4 - 10.5 mg/dL    GFR calc non Af Amer 74 (*) >90 mL/min    GFR calc Af Amer 85 (*) >90 mL/min     No results found.  Review of Systems  HENT: Positive for neck pain.   Musculoskeletal: Positive for back pain.  All other systems reviewed and are negative.   Blood pressure 152/58, pulse 98, temperature 97.9 F (36.6 C), temperature source Oral, resp. rate 16, height 5\' 2"  (1.575 m), weight 58.106 kg (128 lb 1.6 oz), SpO2 94.00%. Physical Exam  Constitutional: She appears well-developed and well-nourished.  HENT:  Head: Normocephalic.    Eyes: Pupils are equal, round, and reactive to light.  Musculoskeletal: She exhibits tenderness.       Right hip: She exhibits tenderness.    Assessment/Plan: This is a non-operative fracture.  She can be WBAT and f/u with me in my office in 3 weeks.  Arijana Narayan,STEPHEN D 06/20/2012, 4:03 PM

## 2012-06-20 NOTE — Progress Notes (Signed)
Subjective:  No CP/SOB  Objective:  Temp:  [98.4 F (36.9 C)-99.2 F (37.3 C)] 99.2 F (37.3 C) (07/27 0312) Pulse Rate:  [59-71] 65  (07/27 0312) Resp:  [18] 18  (07/27 0312) BP: (124-156)/(47-65) 156/65 mmHg (07/27 0312) SpO2:  [90 %-99 %] 99 % (07/27 0312) Weight change:   Intake/Output from previous day: 07/26 0701 - 07/27 0700 In: -  Out: 2075 [Urine:2075]  Intake/Output from this shift:    Physical Exam: General appearance: alert, cooperative, appears stated age, mild distress and Significant bruising on face Neck: no adenopathy, no carotid bruit, no JVD, supple, symmetrical, trachea midline and thyroid not enlarged, symmetric, no tenderness/mass/nodules Lungs: clear to auscultation bilaterally Heart: regular rate and rhythm, S1, S2 normal, no murmur, click, rub or gallop Extremities: extremities normal, atraumatic, no cyanosis or edema  Lab Results: Results for orders placed during the hospital encounter of 06/18/12 (from the past 48 hour(s))  CARDIAC PANEL(CRET KIN+CKTOT+MB+TROPI)     Status: Abnormal   Collection Time   06/18/12  9:50 AM      Component Value Range Comment   Total CK 291 (*) 7 - 177 U/L    CK, MB 7.9 (*) 0.3 - 4.0 ng/mL    Troponin I <0.30  <0.30 ng/mL    Relative Index 2.7 (*) 0.0 - 2.5   POCT I-STAT TROPONIN I     Status: Normal   Collection Time   06/18/12  9:57 AM      Component Value Range Comment   Troponin i, poc 0.00  0.00 - 0.08 ng/mL    Comment 3            POCT I-STAT TROPONIN I     Status: Normal   Collection Time   06/18/12  1:51 PM      Component Value Range Comment   Troponin i, poc 0.02  0.00 - 0.08 ng/mL    Comment 3            TROPONIN I     Status: Normal   Collection Time   06/18/12  6:15 PM      Component Value Range Comment   Troponin I <0.30  <0.30 ng/mL   TROPONIN I     Status: Normal   Collection Time   06/19/12  1:47 AM      Component Value Range Comment   Troponin I <0.30  <0.30 ng/mL   CBC     Status:  Abnormal   Collection Time   06/19/12  1:47 AM      Component Value Range Comment   WBC 9.0  4.0 - 10.5 K/uL    RBC 2.94 (*) 3.87 - 5.11 MIL/uL    Hemoglobin 9.3 (*) 12.0 - 15.0 g/dL DELTA CHECK NOTED   HCT 27.1 (*) 36.0 - 46.0 %    MCV 92.2  78.0 - 100.0 fL    MCH 31.6  26.0 - 34.0 pg    MCHC 34.3  30.0 - 36.0 g/dL    RDW 40.9  81.1 - 91.4 %    Platelets 121 (*) 150 - 400 K/uL   BASIC METABOLIC PANEL     Status: Abnormal   Collection Time   06/19/12  1:47 AM      Component Value Range Comment   Sodium 135  135 - 145 mEq/L    Potassium 4.2  3.5 - 5.1 mEq/L    Chloride 99  96 - 112 mEq/L    CO2 27  19 -  32 mEq/L    Glucose, Bld 107 (*) 70 - 99 mg/dL    BUN 32 (*) 6 - 23 mg/dL DELTA CHECK NOTED   Creatinine, Ser 1.22 (*) 0.50 - 1.10 mg/dL    Calcium 8.5  8.4 - 14.7 mg/dL    GFR calc non Af Amer 39 (*) >90 mL/min    GFR calc Af Amer 45 (*) >90 mL/min   CBC     Status: Abnormal   Collection Time   06/20/12  5:05 AM      Component Value Range Comment   WBC 6.8  4.0 - 10.5 K/uL    RBC 2.73 (*) 3.87 - 5.11 MIL/uL    Hemoglobin 8.6 (*) 12.0 - 15.0 g/dL    HCT 82.9 (*) 56.2 - 46.0 %    MCV 92.3  78.0 - 100.0 fL    MCH 31.5  26.0 - 34.0 pg    MCHC 34.1  30.0 - 36.0 g/dL    RDW 13.0  86.5 - 78.4 %    Platelets 91 (*) 150 - 400 K/uL   BASIC METABOLIC PANEL     Status: Abnormal   Collection Time   06/20/12  5:05 AM      Component Value Range Comment   Sodium 137  135 - 145 mEq/L    Potassium 3.7  3.5 - 5.1 mEq/L    Chloride 102  96 - 112 mEq/L    CO2 29  19 - 32 mEq/L    Glucose, Bld 116 (*) 70 - 99 mg/dL    BUN 21  6 - 23 mg/dL DELTA CHECK NOTED   Creatinine, Ser 0.76  0.50 - 1.10 mg/dL DELTA CHECK NOTED   Calcium 8.1 (*) 8.4 - 10.5 mg/dL    GFR calc non Af Amer 74 (*) >90 mL/min    GFR calc Af Amer 85 (*) >90 mL/min     Imaging: Imaging results have been reviewed  Assessment/Plan:   1. Principal Problem: 2.  *Syncope 3. Active Problems: 4.  HYPERLIPIDEMIA 5.   HYPERTENSION 6.  CORONARY ARTERY DISEASE 7.  Memory loss 8.  Hx of cardiac pacemaker, medtronic for tachy/brady syndrome 9.  Anemia 10.  Pelvic fracture 11.   Time Spent Directly with Patient:  20 minutes  Length of Stay:  LOS: 2 days   Pt tripped getting OOB. Denies syncope. 2D unremarkable. Nl LV Fxn. PTVPM interrogated and no arrhythmias detected.. Exam benign except for bruising. Cardiac stable. Will need ortho eval. Will see again on Monday.  Runell Gess 06/20/2012, 9:47 AM

## 2012-06-20 NOTE — Progress Notes (Signed)
  Subjective: Patient reports no specific urologic concerns. Foley catheter appears to be draining well and urine is clear.  Objective: Vital signs in last 24 hours: Temp:  [97.9 F (36.6 C)-99.2 F (37.3 C)] 97.9 F (36.6 C) (07/27 1236) Pulse Rate:  [59-98] 98  (07/27 1243) Resp:  [16-18] 16  (07/27 1236) BP: (124-159)/(47-74) 152/58 mmHg (07/27 1243) SpO2:  [90 %-99 %] 94 % (07/27 1243)  Intake/Output from previous day: 07/26 0701 - 07/27 0700 In: 240 [P.O.:240] Out: 2075 [Urine:2075] Intake/Output this shift:    Physical Exam:  Constitutional: Vital signs reviewed. WD WN in NAD   No other significant change.  Lab Results:  Basename 06/20/12 0505 06/19/12 0147 06/18/12 0937  HGB 8.6* 9.3* 12.5  HCT 25.2* 27.1* 36.7   BMET  Basename 06/20/12 0505 06/19/12 0147  NA 137 135  K 3.7 4.2  CL 102 99  CO2 29 27  GLUCOSE 116* 107*  BUN 21 32*  CREATININE 0.76 1.22*  CALCIUM 8.1* 8.5   No results found for this basename: LABPT:3,INR:3 in the last 72 hours No results found for this basename: LABURIN:1 in the last 72 hours Results for orders placed during the hospital encounter of 07/26/10  SURGICAL PCR SCREEN     Status: Normal   Collection Time   07/18/10 12:50 PM      Component Value Range Status Comment   MRSA, PCR NEGATIVE  NEGATIVE Final    Staphylococcus aureus    NEGATIVE Final    Value: NEGATIVE            The Xpert SA Assay (FDA     approved for NASAL specimens     only), is one component of     a comprehensive surveillance     program.  It is not intended     to diagnose infection nor to     guide or monitor treatment.    Studies/Results: Dg Chest 1 View  06/18/2012  *RADIOLOGY REPORT*  Clinical Data: Hip/pelvic fracture, preoperative assessment  CHEST - 1 VIEW  Comparison: 01/21/2011  Findings: Left subclavian sequential transvenous pacemaker leads project at right atrium and right ventricle. Enlargement of cardiac silhouette. Calcified tortuous  aorta. Pulmonary vascularity normal. Lungs clear. Question calcified granulomata projecting over right lung. No pleural effusion or pneumothorax. Compression fracture mid thoracic vertebra with evidence of prior spinal augmentation procedure.  IMPRESSION: Enlargement of cardiac silhouette post pacemaker. No acute abnormalities.  Original Report Authenticated By: Lollie Marrow, M.D.    Assessment/Plan:   As per my last note. The patient has recurrent urinary retention and has had some problems in the past. Foley catheter should be left indwelling for the time being. She will be reassessed by her regular urologist [name]   LOS: 2 days   [name] 06/20/2012, 1:15 PM

## 2012-06-20 NOTE — Progress Notes (Signed)
PT Cancellation Note  PT orders received, noted pt not seen by ortho yet for pelvic fracture. Will hold eval at this time and f/u tomorrow.   [facility] HELEN 06/20/2012, 12:56 PM Pager: [phone]

## 2012-06-21 LAB — URINE CULTURE: Colony Count: NO GROWTH

## 2012-06-21 LAB — CBC
Hemoglobin: 8.6 g/dL — ABNORMAL LOW (ref 12.0–15.0)
MCH: 31.5 pg (ref 26.0–34.0)
MCHC: 34.3 g/dL (ref 30.0–36.0)
Platelets: 96 10*3/uL — ABNORMAL LOW (ref 150–400)
RDW: 13.8 % (ref 11.5–15.5)

## 2012-06-21 LAB — BASIC METABOLIC PANEL
Calcium: 8 mg/dL — ABNORMAL LOW (ref 8.4–10.5)
GFR calc Af Amer: 84 mL/min — ABNORMAL LOW (ref >90)
GFR calc non Af Amer: 73 mL/min — ABNORMAL LOW (ref >90)
Glucose, Bld: 124 mg/dL — ABNORMAL HIGH (ref 70–99)
Sodium: 137 mEq/L (ref 135–145)

## 2012-06-21 MED ORDER — TRAZODONE HCL 100 MG PO TABS
100.0000 mg | ORAL_TABLET | Freq: Once | ORAL | Status: AC
  Administered 2012-06-21: 100 mg via ORAL
  Filled 2012-06-21: qty 1

## 2012-06-21 MED ORDER — BISACODYL 5 MG PO TBEC: 5.0000 mg | DELAYED_RELEASE_TABLET | Freq: Two times a day (BID) | ORAL | Status: DC | PRN

## 2012-06-21 NOTE — Evaluation (Signed)
Physical Therapy Evaluation Patient Details Name: [name] MRN: [id number] DOB: Sep 12, 1922 Today's Date: 06/21/2012 Time: 4098-1191 PT Time Calculation (min): 24 min  PT Assessment / Plan / Recommendation Clinical Impression  Patient is an 78 yo female admitted after fall with pelvic fracture.  Patient required +2 assist for all mobility today due to pain.  Patient lives alone - do not feel patient can manage at home alone safely at this point.  Recommend ST-SNF for continued therapy at discharge.  Will follow acutely for mobility/gait training and education.    PT Assessment  Patient needs continued PT services    Follow Up Recommendations  Skilled nursing facility    Barriers to Discharge Decreased caregiver support      Equipment Recommendations  Defer to next venue    Recommendations for Other Services     Frequency Min 5X/week    Precautions / Restrictions Precautions Precautions: Fall Restrictions Weight Bearing Restrictions: Yes RLE Weight Bearing: Weight bearing as tolerated LLE Weight Bearing: Weight bearing as tolerated   Pertinent Vitals/Pain Pain limited patient's mobility today.      Mobility  Bed Mobility Bed Mobility: Supine to Sit;Sitting - Scoot to Edge of Bed Supine to Sit: 1: +2 Total assist;HOB flat Supine to Sit: Patient Percentage: 40% Sitting - Scoot to Edge of Bed: 3: Mod assist Details for Bed Mobility Assistance: Used bed pad to assist patient to sitting position.  Assist required due to pain in pelvis.  Patient able to use UE's to assist raising trunk Transfers Transfers: Sit to Stand;Stand to Sit Sit to Stand: 1: +2 Total assist;With upper extremity assist;From bed Sit to Stand: Patient Percentage: 60% Stand to Sit: 1: +2 Total assist;With upper extremity assist;With armrests;To chair/3-in-1 Stand to Sit: Patient Percentage: 60% Details for Transfer Assistance: Verbal cues for safety and proper hand placement for transfers.  Assist to  initiate sit to stand.  Assist to slowly lower into chair. Ambulation/Gait Ambulation/Gait Assistance: 1: +2 Total assist Ambulation/Gait: Patient Percentage: 60% Ambulation Distance (Feet): 3 Feet Assistive device: Rolling walker Ambulation/Gait Assistance Details: Verbal cues to safely use RW.  Assist to unweight LLE to take a step due to pain with RLE.   Gait Pattern: Step-to pattern;Decreased stance time - right;Decreased step length - left;Shuffle;Antalgic;Trunk flexed    Exercises     PT Diagnosis: Difficulty walking;Generalized weakness;Acute pain  PT Problem List: Decreased strength;Decreased activity tolerance;Decreased mobility;Decreased knowledge of use of DME;Decreased knowledge of precautions;Pain PT Treatment Interventions: DME instruction;Gait training;Functional mobility training;Therapeutic exercise;Patient/family education   PT Goals Acute Rehab PT Goals PT Goal Formulation: With patient Time For Goal Achievement: 07/05/12 Potential to Achieve Goals: Good Pt will go Supine/Side to Sit: with min assist;with HOB 0 degrees PT Goal: Supine/Side to Sit - Progress: Goal set today Pt will go Sit to Supine/Side: with min assist;with HOB 0 degrees PT Goal: Sit to Supine/Side - Progress: Goal set today Pt will go Sit to Stand: with min assist;with upper extremity assist PT Goal: Sit to Stand - Progress: Goal set today Pt will go Stand to Sit: with supervision;with upper extremity assist PT Goal: Stand to Sit - Progress: Goal set today Pt will Transfer Bed to Chair/Chair to Bed: with min assist PT Transfer Goal: Bed to Chair/Chair to Bed - Progress: Goal set today Pt will Ambulate: 51 - 150 feet;with min assist;with rolling walker PT Goal: Ambulate - Progress: Goal set today  Visit Information  Last PT Received On: 06/21/12 Assistance Needed: +2    Subjective  Data  Subjective: "Now I can tell I really hurt myself" Patient Stated Goal: To walk   Prior Functioning  Home  Living Lives With: Alone Available Help at Discharge: Friend(s);Available PRN/intermittently Type of Home: Apartment Home Access: Level entry Home Layout: One level Bathroom Shower/Tub: Health visitor: Standard Bathroom Accessibility: Yes How Accessible: Accessible via walker Home Adaptive Equipment: Bedside commode/3-in-1;Shower chair with back;Straight cane;Walker - standard Prior Function Level of Independence: Needs assistance Needs Assistance: Light Housekeeping Light Housekeeping: Moderate (Housekeeper every other week for heavy cleaning) Able to Take Stairs?: No Driving: No Vocation: Retired Comments: Walks to [employer] or has groceries delivered; uses [employer] for rides. Communication Communication: HOH    Cognition  Overall Cognitive Status: Appears within functional limits for tasks assessed/performed (Occasionally repeats herself) Arousal/Alertness: Awake/alert Orientation Level: Appears intact for tasks assessed Behavior During Session: [facility] for tasks performed    Extremity/Trunk Assessment Right Upper Extremity Assessment RUE ROM/Strength/Tone: [facility] for tasks assessed Left Upper Extremity Assessment LUE ROM/Strength/Tone: [facility] for tasks assessed Right Lower Extremity Assessment RLE ROM/Strength/Tone: Deficits;Unable to fully assess;Due to pain RLE ROM/Strength/Tone Deficits: Decreased strength due to pain RLE Sensation: WFL - Light Touch Left Lower Extremity Assessment LLE ROM/Strength/Tone: WFL for tasks assessed LLE Sensation: WFL - Light Touch   Balance    End of Session PT - End of Session Equipment Utilized During Treatment: Gait belt Activity Tolerance: Patient limited by pain Patient left: in chair;with call bell/phone within reach Nurse Communication: Mobility status;Weight bearing status  GP     Vena Austria 06/21/2012, 10:11 AM Durenda Hurt. Renaldo Fiddler, [facility] Acute Rehab Services Pager [phone]

## 2012-06-21 NOTE — Progress Notes (Signed)
Clinical Social Work Department CLINICAL SOCIAL WORK PLACEMENT NOTE 06/21/2012  Patient:  [name], [name]  Account Number:  [id number] Admit date:  06/18/2012  Clinical Social Worker:  Dellie Burns, Theresia Majors  Date/time:  06/21/2012 12:00 N  Clinical Social Work is seeking post-discharge placement for this patient at the following level of care:   SKILLED NURSING   (*CSW will update this form in Epic as items are completed)   06/21/2012  Patient/family provided with [facility] Department of Clinical Social Work's list of facilities offering this level of care within the geographic area requested by the patient (or if unable, by the patient's family).  06/21/2012  Patient/family informed of their freedom to choose among providers that offer the needed level of care, that participate in Medicare, Medicaid or managed care program needed by the patient, have an available bed and are willing to accept the patient.  06/21/2012  Patient/family informed of [facility]' ownership interest in [facility], as well as of the fact that they are under no obligation to receive care at this facility.  PASARR submitted to EDS on 06/21/2012 PASARR number received from EDS on 06/21/2012  FL2 transmitted to all facilities in geographic area requested by pt/family on  06/21/2012 FL2 transmitted to all facilities within larger geographic area on   Patient informed that his/her managed care company has contracts with or will negotiate with  certain facilities, including the following:     Patient/family informed of bed offers received:   Patient chooses bed at  Physician recommends and patient chooses bed at    Patient to be transferred to  on   Patient to be transferred to facility by   The following physician request were entered in Epic:   Additional Comments: Pt with existing PASARR #  Dellie Burns, MSW, LCSWA [phone] (weekend)

## 2012-06-21 NOTE — Progress Notes (Addendum)
[facility]                                                                                Patient Demographics  [name], is a 78 y.o. female  UJW:[id number]  FAO:[id number]  DOB - [birth year]  Admit date - 06/18/2012  Admitting Physician Huey Bienenstock, MD  Outpatient Primary MD for the patient is [name], MD  LOS - 3   Chief Complaint  Patient presents with  . Fall        Assessment & Plan    Principal Problem:  *Syncope Active Problems:  HYPERLIPIDEMIA  HYPERTENSION  CORONARY ARTERY DISEASE  Memory loss  Hx of cardiac pacemaker, medtronic for tachy/brady syndrome  Anemia  Pelvic fracture  Urinary retention  1. Syncope. No evidence on telemetry monitor, patient rhythm is paced, cardiology consult appreciated, normal EF on echo, no arrhythmias on pacemaker interrogation,patient is orthostatic as HR increased from 78 to 99,, we'll continue to hold antihypertensive medication, negative troponins x3, will check carotid Doppler, continue on fall precautions.   2. Acute right obturator ring fracture: Orthopedic Dr. Valentina Gu consult appreciated,will consult PT, weight bearing as tolerated.  3. Anemia. Patient is status post fall with significant facial and neck and upper chest hematoma and bruising, hold subcutaneous heparin.will hold plavix, Monitor H&H closely, if  hemoglobin continues to trend down, will check Hemoccult(no BM), as well this is  delusional from her IV fluid as patient has drop in white blood cell and platelet count as well.  4.UTI. Continue with Rocephin, follow urine culture.  5. Coronary artery disease. No complaints of chest pain or shortness of breath ,holding plavix due to significant bruising with anemia..  6. Memory loss continue with Aricept   7. Urinary retention. Patient was hard  to insert Foley, inserted by urology DR Manon Hilding Code Status: FULL   Family Communication: with patient and gradson  Disposition Plan:   SNF    Procedures  Echo pacemaker interrogation   Consults  Cardiology Corinda Gubler  Urology/Dr Isabel Caprice Orthopedic/ Dr Sherlean Foot PT consult  D/W son [name] on phone 7/27   Antibiotics   Rocephin 7/26   Time Spent in minutes   35   DVT Prophylaxis SCD Anuradha Chabot M.D on 06/21/2012 at 9:19 AM  Between 7am to 7pm - Pager - [phone]  After 7pm go to [email or web address] - password [email or web address]  And look for the night coverage person covering for me after hours  [facility] Office  [phone]    Subjective:   [name] today has, No headache, No chest pain, No abdominal pain - No Nausea, No new weakness tingling or numbness, No Cough - SOB, c/o right hip pain.  Objective:   Filed Vitals:   06/20/12 1240 06/20/12 1243 06/20/12 2032 06/21/12 0625  BP: 156/74 152/58 150/75 145/74  Pulse: 88 98 68 70  Temp:   99 F (37.2 C) 99.1 F (37.3 C)  TempSrc:   Oral Oral  Resp:   18 18  Height:      Weight:      SpO2: 90% 94% 94% 92%    Wt Readings from Last 3 Encounters:  06/18/12 58.106 kg (  128 lb 1.6 oz)  06/17/11 56.7 kg (125 lb)  03/13/11 59.194 kg (130 lb 8 oz)     Intake/Output Summary (Last 24 hours) at 06/21/12 0919 Last data filed at 06/21/12 0500  Gross per 24 hour  Intake    240 ml  Output   1700 ml  Net  -1460 ml    Exam 1. Generalelderly female lying in bed in NAD,  2. Normal affect and insight, Not Suicidal or Homicidal, Awake Alert, Oriented X 3.  3. No F.N deficits, ALL C.Nerves Intact, Strength 5/5 all 4 extremities, Sensation intact all 4 extremities, Plantars down going.  4. Ears and Eyes appear Normal, Conjunctivae clear, right eye and he anisocoria . Moist Oral Mucosa.Right lateral orbital hematoma multiple facial bruises mainly in the left neck chen and surrounding ear area and upper chest. 5. Supple Neck, No JVD, No cervical lymphadenopathy appriciated, No Carotid Bruits.  6. Symmetrical Chest wall movement, Good air  movement bilaterally, CTAB.  7. RRR, No Gallops, Rubs or Murmurs, No Parasternal Heave.  8. Positive Bowel Sounds, Abdomen Soft, Non tender, No organomegaly appriciated,No rebound -guarding or rigidity.  9. No Cyanosis, Normal Skin Turgor, No Skin Rash or Bruise.  10. Good muscle tone, joints appear normal , no effusions,  ROM decreased due to pain in right lower ext.     Data Review   CBC  Lab 06/21/12 0520 06/20/12 0505 06/19/12 0147 06/18/12 0937  WBC 6.6 6.8 9.0 15.5*  HGB 8.6* 8.6* 9.3* 12.5  HCT 25.1* 25.2* 27.1* 36.7  PLT 96* 91* 121* 153  MCV 91.9 92.3 92.2 92.4  MCH 31.5 31.5 31.6 31.5  MCHC 34.3 34.1 34.3 34.1  RDW 13.8 13.9 14.1 13.7  LYMPHSABS -- -- -- 1.6  MONOABS -- -- -- 0.9  EOSABS -- -- -- 0.1  BASOSABS -- -- -- 0.0  BANDABS -- -- -- --    Chemistries   Lab 06/21/12 0520 06/20/12 0505 06/19/12 0147 06/18/12 0937  NA 137 137 135 140  K 3.8 3.7 4.2 3.5  CL 101 102 99 100  CO2 27 29 27 31   GLUCOSE 124* 116* 107* 167*  BUN 17 21 32* 21  CREATININE 0.79 0.76 1.22* 0.88  CALCIUM 8.0* 8.1* 8.5 9.8  MG -- -- -- --  AST -- -- -- --  ALT -- -- -- --  ALKPHOS -- -- -- --  BILITOT -- -- -- --   ------------------------------------------------------------------------------------------------------------------ estimated creatinine clearance is 39.2 ml/min (by C-G formula based on Cr of 0.79). ------------------------------------------------------------------------------------------------------------------ No results found for this basename: HGBA1C:2 in the last 72 hours ------------------------------------------------------------------------------------------------------------------ No results found for this basename: CHOL:2,HDL:2,LDLCALC:2,TRIG:2,CHOLHDL:2,LDLDIRECT:2 in the last 72 hours ------------------------------------------------------------------------------------------------------------------ No results found for this basename:  TSH,T4TOTAL,FREET3,T3FREE,THYROIDAB in the last 72 hours ------------------------------------------------------------------------------------------------------------------ No results found for this basename: VITAMINB12:2,FOLATE:2,FERRITIN:2,TIBC:2,IRON:2,RETICCTPCT:2 in the last 72 hours  Coagulation profile No results found for this basename: INR:5,PROTIME:5 in the last 168 hours  No results found for this basename: DDIMER:2 in the last 72 hours  Cardiac Enzymes  Lab 06/19/12 0147 06/18/12 1815 06/18/12 0950  CKMB -- -- 7.9*  TROPONINI <0.30 <0.30 <0.30  MYOGLOBIN -- -- --   ------------------------------------------------------------------------------------------------------------------ No components found with this basename: POCBNP:3  Micro Results No results found for this or any previous visit (from the past 240 hour(s)).  Radiology Reports Dg Chest 1 View  06/18/2012  *RADIOLOGY REPORT*  Clinical Data: Hip/pelvic fracture, preoperative assessment  CHEST - 1 VIEW  Comparison: 01/21/2011  Findings: Left subclavian  sequential transvenous pacemaker leads project at right atrium and right ventricle. Enlargement of cardiac silhouette. Calcified tortuous aorta. Pulmonary vascularity normal. Lungs clear. Question calcified granulomata projecting over right lung. No pleural effusion or pneumothorax. Compression fracture mid thoracic vertebra with evidence of prior spinal augmentation procedure.  IMPRESSION: Enlargement of cardiac silhouette post pacemaker. No acute abnormalities.  Original Report Authenticated By: Lollie Marrow, M.D.   Dg Hip Complete Right  06/18/2012  *RADIOLOGY REPORT*  Clinical Data: Fall.  Right hip pain.  RIGHT HIP - COMPLETE 2+ VIEW  Comparison: 06/05/2010.  Findings: There are new right parasymphyseal pubic fractures which appear acute, with sharply marginated edges.  Probable complimentary fracture involving the mid right inferior pubic ramus.  The sacral arcades  appear intact.  The left obturator ring is intact. Iliac artery atherosclerosis.  Right total hip arthroplasty is present with unchanged displacement of the lesser trochanter from old fracture.  IMPRESSION: Acute nondisplaced right obturator ring fractures.  No contralateral obturator ring or sacral fracture identified.  Original Report Authenticated By: Andreas Newport, M.D.   Ct Head Wo Contrast  06/18/2012  *RADIOLOGY REPORT*  Clinical Data:  Found on floor.  Unwitnessed fall.  Found down. Question loss of consciousness.  Right periorbital hematoma. Headache.  CT HEAD WITHOUT CONTRAST CT CERVICAL SPINE WITHOUT CONTRAST  Technique:  Multidetector CT imaging of the head and cervical spine was performed following the standard protocol without intravenous contrast.  Multiplanar CT image reconstructions of the cervical spine were also generated.  Comparison:  CT head 06/18/2012.  CT HEAD  Findings: Multiple areas of face and scalp soft tissue swelling are present.  There is a soft tissue hematoma lateral to the right orbital rim measuring 21 mm x 13 mm (image 6 series 3).  Right ocular banding.  Bilateral lens extractions noted. There is no skull fracture.  Left parietal scalp soft tissue stranding compatible with contusion.  Stranding is present in the soft tissues overlying the left zygomatic arch, also compatible with contusion.  No mass lesion, mass effect, midline shift, hydrocephalus, hemorrhage.  No acute territorial cortical ischemia/infarct. Atrophy and chronic ischemic white matter disease is present. Left parietal and posterior temporal encephalomalacia are unchanged compared to prior, better seen on previous study.  IMPRESSION:  1.  No acute intracranial abnormality. 2.  Right periorbital subcutaneous hematoma. Marked swelling over the left zygomatic arch and into the left cheek extending to the left parotid is presumably post-traumatic and compatible with contusion. 3.  Small left parietal scalp  contusion.  CT ORBITS WITHOUT CONTRAST  Technique:  Multidetector CT imaging of the orbits was performed following the standard protocol without intravenous contrast.  Findings:  There is no orbital fracture.  Marker overlies the right side of face.  Orbital floor and medial orbital wall appears intact.  Ocular changes as described above.  Right periorbital subcutaneous hematoma.  No retro bulb are or intraconal hematoma. Globes appear intact.  Marked soft tissue swelling is present in the left cheek extending to the left parotid, partially visualized. Presumably this is post-traumatic.  IMPRESSION:  Negative for orbital fracture.  CT CERVICAL SPINE  Findings: Reversal of the normal cervical lordosis is present. Apex of the reversal is at C5-C6.  There is no cervical spine fracture or dislocation.  Severe facet arthrosis and degenerative disc disease is present most pronounced at C4-C5 through C5-C6. Craniocervical alignment is within normal limits.  Odontoid is intact.  Mastoid air cells appear within normal limits.  The degenerative changes with  mild thickening of the transverse ligament of the atlas and calcification.  Carotid atherosclerosis. 3 mm anterolisthesis of C4 ounce C5 appears degenerative, associated with facet arthrosis.  Mild to moderate bony central stenosis at C4-C5 and C5-C6.  Multilevel foraminal stenosis, generally worse on the left.  IMPRESSION: No acute abnormality.  No cervical spine fracture or dislocation. Moderate to severe multilevel cervical spondylosis.  Original Report Authenticated By: Andreas Newport, M.D.   Ct Cervical Spine Wo Contrast  06/18/2012  *RADIOLOGY REPORT*  Clinical Data:  Found on floor.  Unwitnessed fall.  Found down. Question loss of consciousness.  Right periorbital hematoma. Headache.  CT HEAD WITHOUT CONTRAST CT CERVICAL SPINE WITHOUT CONTRAST  Technique:  Multidetector CT imaging of the head and cervical spine was performed following the standard protocol  without intravenous contrast.  Multiplanar CT image reconstructions of the cervical spine were also generated.  Comparison:  CT head 06/18/2012.  CT HEAD  Findings: Multiple areas of face and scalp soft tissue swelling are present.  There is a soft tissue hematoma lateral to the right orbital rim measuring 21 mm x 13 mm (image 6 series 3).  Right ocular banding.  Bilateral lens extractions noted. There is no skull fracture.  Left parietal scalp soft tissue stranding compatible with contusion.  Stranding is present in the soft tissues overlying the left zygomatic arch, also compatible with contusion.  No mass lesion, mass effect, midline shift, hydrocephalus, hemorrhage.  No acute territorial cortical ischemia/infarct. Atrophy and chronic ischemic white matter disease is present. Left parietal and posterior temporal encephalomalacia are unchanged compared to prior, better seen on previous study.  IMPRESSION:  1.  No acute intracranial abnormality. 2.  Right periorbital subcutaneous hematoma. Marked swelling over the left zygomatic arch and into the left cheek extending to the left parotid is presumably post-traumatic and compatible with contusion. 3.  Small left parietal scalp contusion.  CT ORBITS WITHOUT CONTRAST  Technique:  Multidetector CT imaging of the orbits was performed following the standard protocol without intravenous contrast.  Findings:  There is no orbital fracture.  Marker overlies the right side of face.  Orbital floor and medial orbital wall appears intact.  Ocular changes as described above.  Right periorbital subcutaneous hematoma.  No retro bulb are or intraconal hematoma. Globes appear intact.  Marked soft tissue swelling is present in the left cheek extending to the left parotid, partially visualized. Presumably this is post-traumatic.  IMPRESSION:  Negative for orbital fracture.  CT CERVICAL SPINE  Findings: Reversal of the normal cervical lordosis is present. Apex of the reversal is at  C5-C6.  There is no cervical spine fracture or dislocation.  Severe facet arthrosis and degenerative disc disease is present most pronounced at C4-C5 through C5-C6. Craniocervical alignment is within normal limits.  Odontoid is intact.  Mastoid air cells appear within normal limits.  The degenerative changes with mild thickening of the transverse ligament of the atlas and calcification.  Carotid atherosclerosis. 3 mm anterolisthesis of C4 ounce C5 appears degenerative, associated with facet arthrosis.  Mild to moderate bony central stenosis at C4-C5 and C5-C6.  Multilevel foraminal stenosis, generally worse on the left.  IMPRESSION: No acute abnormality.  No cervical spine fracture or dislocation. Moderate to severe multilevel cervical spondylosis.  Original Report Authenticated By: Andreas Newport, M.D.   Ct Maxillofacial Wo Cm  06/18/2012  *RADIOLOGY REPORT*  Clinical Data: Fall, left sided face bruising, right eye swollen shut.  CT MAXILLOFACIAL WITHOUT CONTRAST  Technique:  Multidetector CT imaging  of the maxillofacial structures was performed. Multiplanar CT image reconstructions were also generated.  Comparison: Head CT same day  Findings: There is a small hematoma lateral to the right orbit and superior to the ygomatic arch (image 65, series 3).  There is a large hematoma within the left muscles of mastication lateral the angled jaw measuring 2.5 cm in thickness compared to the normal right side which measures 1.3 cm in thickness.  There is subcutaneus stranding and edema in the soft tissues of the left face adjacent to the mandible.  There is some stranding in the parapharyngeal space on the left additionally.  No evidence of orbital fracture.  The zygoma are intact.  No evidence of maxillary fracture or pterygoid plate fracture.  No fluid in the paranasal sinuses are frontal sinus.  Nasal bones are intact.  The mandibular condyles are located.  No evidence of mandible fracture.  IMPRESSION:  1.  A large  hematoma within the left muscles of mastication. 2.  Extensive subcutaneous edema and likely hemorrhage within the soft tissues of the left face adjacent to of the angle jaw extending into the parapharyngeal space. 3.  No evidence of facial fracture. 4.  Smaller left hematoma lateral to the right lateral orbit.  Original Report Authenticated By: Genevive Bi, M.D.   Ct Orbitss W/o Cm  06/18/2012  *RADIOLOGY REPORT*  Clinical Data:  Found on floor.  Unwitnessed fall.  Found down. Question loss of consciousness.  Right periorbital hematoma. Headache.  CT HEAD WITHOUT CONTRAST CT CERVICAL SPINE WITHOUT CONTRAST  Technique:  Multidetector CT imaging of the head and cervical spine was performed following the standard protocol without intravenous contrast.  Multiplanar CT image reconstructions of the cervical spine were also generated.  Comparison:  CT head 06/18/2012.  CT HEAD  Findings: Multiple areas of face and scalp soft tissue swelling are present.  There is a soft tissue hematoma lateral to the right orbital rim measuring 21 mm x 13 mm (image 6 series 3).  Right ocular banding.  Bilateral lens extractions noted. There is no skull fracture.  Left parietal scalp soft tissue stranding compatible with contusion.  Stranding is present in the soft tissues overlying the left zygomatic arch, also compatible with contusion.  No mass lesion, mass effect, midline shift, hydrocephalus, hemorrhage.  No acute territorial cortical ischemia/infarct. Atrophy and chronic ischemic white matter disease is present. Left parietal and posterior temporal encephalomalacia are unchanged compared to prior, better seen on previous study.  IMPRESSION:  1.  No acute intracranial abnormality. 2.  Right periorbital subcutaneous hematoma. Marked swelling over the left zygomatic arch and into the left cheek extending to the left parotid is presumably post-traumatic and compatible with contusion. 3.  Small left parietal scalp contusion.  CT  ORBITS WITHOUT CONTRAST  Technique:  Multidetector CT imaging of the orbits was performed following the standard protocol without intravenous contrast.  Findings:  There is no orbital fracture.  Marker overlies the right side of face.  Orbital floor and medial orbital wall appears intact.  Ocular changes as described above.  Right periorbital subcutaneous hematoma.  No retro bulb are or intraconal hematoma. Globes appear intact.  Marked soft tissue swelling is present in the left cheek extending to the left parotid, partially visualized. Presumably this is post-traumatic.  IMPRESSION:  Negative for orbital fracture.  CT CERVICAL SPINE  Findings: Reversal of the normal cervical lordosis is present. Apex of the reversal is at C5-C6.  There is no cervical spine fracture or  dislocation.  Severe facet arthrosis and degenerative disc disease is present most pronounced at C4-C5 through C5-C6. Craniocervical alignment is within normal limits.  Odontoid is intact.  Mastoid air cells appear within normal limits.  The degenerative changes with mild thickening of the transverse ligament of the atlas and calcification.  Carotid atherosclerosis. 3 mm anterolisthesis of C4 ounce C5 appears degenerative, associated with facet arthrosis.  Mild to moderate bony central stenosis at C4-C5 and C5-C6.  Multilevel foraminal stenosis, generally worse on the left.  IMPRESSION: No acute abnormality.  No cervical spine fracture or dislocation. Moderate to severe multilevel cervical spondylosis.  Original Report Authenticated By: Andreas Newport, M.D.    Scheduled Meds:    . cefTRIAXone (ROCEPHIN)  IV  1 g Intravenous Q24H  . docusate sodium  100 mg Oral BID  . donepezil  5 mg Oral QHS  . feeding supplement  1 Container Oral TID BM  . fesoterodine  4 mg Oral Daily  . mirabegron ER  25 mg Oral QHS  . polyethylene glycol  17 g Oral Daily  . sodium chloride  3 mL Intravenous Q12H  . timolol  1 drop Both Eyes QHS  . traZODone  100 mg  Oral Once  . Vitamin D (Ergocalciferol)  50,000 Units Oral Q30 days  . DISCONTD: clopidogrel  75 mg Oral Daily  . DISCONTD: pantoprazole  40 mg Oral Daily   Continuous Infusions:   PRN Meds:.alum & mag hydroxide-simeth, bisacodyl, bisacodyl, HYDROcodone-acetaminophen, ondansetron (ZOFRAN) IV, ondansetron

## 2012-06-22 DIAGNOSIS — R339 Retention of urine, unspecified: Secondary | ICD-10-CM

## 2012-06-22 LAB — HEMOGLOBIN AND HEMATOCRIT, BLOOD: HCT: 23.6 % — ABNORMAL LOW (ref 36.0–46.0)

## 2012-06-22 MED ORDER — FERROUS SULFATE 220 (44 FE) MG/5ML PO ELIX
220.0000 mg | ORAL_SOLUTION | Freq: Two times a day (BID) | ORAL | Status: DC
  Administered 2012-06-22 – 2012-06-23 (×2): 220 mg via ORAL
  Filled 2012-06-22 (×5): qty 5

## 2012-06-22 MED ORDER — SENNOSIDES-DOCUSATE SODIUM 8.6-50 MG PO TABS
2.0000 | ORAL_TABLET | Freq: Every day | ORAL | Status: DC
  Administered 2012-06-22: 2 via ORAL
  Filled 2012-06-22 (×2): qty 2

## 2012-06-22 MED ORDER — OXYCODONE-ACETAMINOPHEN 5-325 MG PO TABS
1.0000 | ORAL_TABLET | Freq: Four times a day (QID) | ORAL | Status: DC | PRN
  Administered 2012-06-22 – 2012-06-23 (×3): 2 via ORAL
  Filled 2012-06-22 (×4): qty 2

## 2012-06-22 NOTE — Progress Notes (Signed)
[facility]                                                                                Patient Demographics  [name], is a 78 y.o. female  ZOX:[id number]  [id number]  DOB - 1922/05/18  Admit date - 06/18/2012  Admitting Physician Huey Bienenstock, MD  Outpatient Primary MD for the patient is [name], MD  LOS - 4   Chief Complaint  Patient presents with  . Fall        Assessment & Plan    Principal Problem:  *Syncope Active Problems:  HYPERLIPIDEMIA  HYPERTENSION  CORONARY ARTERY DISEASE  Memory loss  Hx of cardiac pacemaker, medtronic for tachy/brady syndrome  Anemia  Pelvic fracture  Urinary retention  1. Syncope. No evidence on telemetry monitor, patient rhythm is paced, cardiology consult appreciated, normal EF on echo, no arrhythmias on pacemaker interrogation,patient is orthostatic as HR increased from 78 to 99, we'll continue to hold antihypertensive medication, negative troponins x3,  carotid Doppler show No significant stenosis., continue on fall precautions.   2. Acute right obturator ring fracture: Orthopedic Dr. Valentina Gu consult appreciated,cont PT, weight bearing as tolerated.  3. Anemia. Patient is status post fall with significant facial and neck and upper chest hematoma and bruising, hold subcutaneous heparin.will hold plavix, Monitor H&H closely, Hemoccult negative., as well this is  delusional from her IV fluid as patient has drop in white blood cell and platelet count as well.  4.UTI. Finished 4 days of rocephin, urine cx showing no growth.  5. Coronary artery disease. No complaints of chest pain or shortness of breath ,holding plavix due to significant bruising with anemia..  6. Memory loss continue with Aricept   7. Urinary retention.Had 857 ml Post void residual , Patient was hard  to insert Foley, inserted by urology DR Manon Hilding.wil d/c foley today for voiding trial.  Code Status: FULL   Family Communication:  with patient and gradson  Disposition Plan:  SNF    Procedures  Echo pacemaker interrogation   Consults  Cardiology Corinda Gubler  Urology/Dr Grapey Orthopedic/ Dr Sherlean Foot PT consult  D/W son Aylanie Cubillos on phone 7/27   Antibiotics   Rocephin 7/26   Time Spent in minutes   35   DVT Prophylaxis SCD Torey Regan M.D on 06/22/2012 at 8:26 AM  Between 7am to 7pm - Pager - [phone]  After 7pm go to [email or web address] - password [email or web address]  And look for the night coverage person covering for me after hours  [facility] Office  [phone]    Subjective:   [name] today has, No headache, No chest pain, No abdominal pain - No Nausea, No new weakness tingling or numbness, No Cough - SOB, c/o right hip pain.  Objective:   Filed Vitals:   06/21/12 0625 06/21/12 1500 06/21/12 2044 06/22/12 0438  BP: 145/74 122/66 134/57 154/61  Pulse: 70 82 76 79  Temp: 99.1 F (37.3 C) 97.5 F (36.4 C) 97.6 F (36.4 C) 98 F (36.7 C)  TempSrc: Oral Oral    Resp: 18 16 18 18   Height:      Weight:      SpO2: 92%  92% 93% 90%    Wt Readings from Last 3 Encounters:  06/18/12 58.106 kg (128 lb 1.6 oz)  06/17/11 56.7 kg (125 lb)  03/13/11 59.194 kg (130 lb 8 oz)     Intake/Output Summary (Last 24 hours) at 06/22/12 0826 Last data filed at 06/22/12 0438  Gross per 24 hour  Intake      0 ml  Output   1200 ml  Net  -1200 ml    Exam 1. Generalelderly female lying in bed in NAD,  2. Normal affect and insight, Not Suicidal or Homicidal, Awake Alert, Oriented X 3.  3. No F.N deficits, ALL C.Nerves Intact, Strength 5/5 all 4 extremities, Sensation intact all 4 extremities, Plantars down going.  4. Ears and Eyes appear Normal, Conjunctivae clear, right eye and he anisocoria . Moist Oral Mucosa.Right lateral orbital hematoma multiple facial bruises mainly in the left neck chen and surrounding ear area and upper chest. 5. Supple Neck, No JVD, No cervical lymphadenopathy  appriciated, No Carotid Bruits.  6. Symmetrical Chest wall movement, Good air movement bilaterally, CTAB.  7. RRR, No Gallops, Rubs or Murmurs, No Parasternal Heave.  8. Positive Bowel Sounds, Abdomen Soft, Non tender, No organomegaly appriciated,No rebound -guarding or rigidity.  9. No Cyanosis, Normal Skin Turgor, No Skin Rash or Bruise.  10. Good muscle tone, joints appear normal , no effusions,  ROM decreased due to pain in right lower ext.     Data Review   CBC  Lab 06/22/12 0500 06/21/12 0520 06/20/12 0505 06/19/12 0147 06/18/12 0937  WBC -- 6.6 6.8 9.0 15.5*  HGB 8.1* 8.6* 8.6* 9.3* 12.5  HCT 23.6* 25.1* 25.2* 27.1* 36.7  PLT -- 96* 91* 121* 153  MCV -- 91.9 92.3 92.2 92.4  MCH -- 31.5 31.5 31.6 31.5  MCHC -- 34.3 34.1 34.3 34.1  RDW -- 13.8 13.9 14.1 13.7  LYMPHSABS -- -- -- -- 1.6  MONOABS -- -- -- -- 0.9  EOSABS -- -- -- -- 0.1  BASOSABS -- -- -- -- 0.0  BANDABS -- -- -- -- --    Chemistries   Lab 06/21/12 0520 06/20/12 0505 06/19/12 0147 06/18/12 0937  NA 137 137 135 140  K 3.8 3.7 4.2 3.5  CL 101 102 99 100  CO2 27 29 27 31   GLUCOSE 124* 116* 107* 167*  BUN 17 21 32* 21  CREATININE 0.79 0.76 1.22* 0.88  CALCIUM 8.0* 8.1* 8.5 9.8  MG -- -- -- --  AST -- -- -- --  ALT -- -- -- --  ALKPHOS -- -- -- --  BILITOT -- -- -- --   ------------------------------------------------------------------------------------------------------------------ estimated creatinine clearance is 39.2 ml/min (by C-G formula based on Cr of 0.79). ------------------------------------------------------------------------------------------------------------------ No results found for this basename: HGBA1C:2 in the last 72 hours ------------------------------------------------------------------------------------------------------------------ No results found for this basename: CHOL:2,HDL:2,LDLCALC:2,TRIG:2,CHOLHDL:2,LDLDIRECT:2 in the last 72  hours ------------------------------------------------------------------------------------------------------------------ No results found for this basename: TSH,T4TOTAL,FREET3,T3FREE,THYROIDAB in the last 72 hours ------------------------------------------------------------------------------------------------------------------ No results found for this basename: VITAMINB12:2,FOLATE:2,FERRITIN:2,TIBC:2,IRON:2,RETICCTPCT:2 in the last 72 hours  Coagulation profile No results found for this basename: INR:5,PROTIME:5 in the last 168 hours  No results found for this basename: DDIMER:2 in the last 72 hours  Cardiac Enzymes  Lab 06/19/12 0147 06/18/12 1815 06/18/12 0950  CKMB -- -- 7.9*  TROPONINI <0.30 <0.30 <0.30  MYOGLOBIN -- -- --   ------------------------------------------------------------------------------------------------------------------ No components found with this basename: POCBNP:3  Micro Results Recent Results (from the past 240 hour(s))  URINE CULTURE  Status: Normal   Collection Time   06/19/12  6:20 PM      Component Value Range Status Comment   Specimen Description URINE, CATHETERIZED   Final    Special Requests PATIENT ON FOLLOWING ROCEPHIN   Final    Culture  Setup Time 06/20/2012 01:49   Final    Colony Count NO GROWTH   Final    Culture NO GROWTH   Final    Report Status 06/21/2012 FINAL   Final     Radiology Reports Dg Chest 1 View  06/18/2012  *RADIOLOGY REPORT*  Clinical Data: Hip/pelvic fracture, preoperative assessment  CHEST - 1 VIEW  Comparison: 01/21/2011  Findings: Left subclavian sequential transvenous pacemaker leads project at right atrium and right ventricle. Enlargement of cardiac silhouette. Calcified tortuous aorta. Pulmonary vascularity normal. Lungs clear. Question calcified granulomata projecting over right lung. No pleural effusion or pneumothorax. Compression fracture mid thoracic vertebra with evidence of prior spinal augmentation  procedure.  IMPRESSION: Enlargement of cardiac silhouette post pacemaker. No acute abnormalities.  Original Report Authenticated By: Lollie Marrow, M.D.   Dg Hip Complete Right  06/18/2012  *RADIOLOGY REPORT*  Clinical Data: Fall.  Right hip pain.  RIGHT HIP - COMPLETE 2+ VIEW  Comparison: 06/05/2010.  Findings: There are new right parasymphyseal pubic fractures which appear acute, with sharply marginated edges.  Probable complimentary fracture involving the mid right inferior pubic ramus.  The sacral arcades appear intact.  The left obturator ring is intact. Iliac artery atherosclerosis.  Right total hip arthroplasty is present with unchanged displacement of the lesser trochanter from old fracture.  IMPRESSION: Acute nondisplaced right obturator ring fractures.  No contralateral obturator ring or sacral fracture identified.  Original Report Authenticated By: Andreas Newport, M.D.   Ct Head Wo Contrast  06/18/2012  *RADIOLOGY REPORT*  Clinical Data:  Found on floor.  Unwitnessed fall.  Found down. Question loss of consciousness.  Right periorbital hematoma. Headache.  CT HEAD WITHOUT CONTRAST CT CERVICAL SPINE WITHOUT CONTRAST  Technique:  Multidetector CT imaging of the head and cervical spine was performed following the standard protocol without intravenous contrast.  Multiplanar CT image reconstructions of the cervical spine were also generated.  Comparison:  CT head 06/18/2012.  CT HEAD  Findings: Multiple areas of face and scalp soft tissue swelling are present.  There is a soft tissue hematoma lateral to the right orbital rim measuring 21 mm x 13 mm (image 6 series 3).  Right ocular banding.  Bilateral lens extractions noted. There is no skull fracture.  Left parietal scalp soft tissue stranding compatible with contusion.  Stranding is present in the soft tissues overlying the left zygomatic arch, also compatible with contusion.  No mass lesion, mass effect, midline shift, hydrocephalus, hemorrhage.  No  acute territorial cortical ischemia/infarct. Atrophy and chronic ischemic white matter disease is present. Left parietal and posterior temporal encephalomalacia are unchanged compared to prior, better seen on previous study.  IMPRESSION:  1.  No acute intracranial abnormality. 2.  Right periorbital subcutaneous hematoma. Marked swelling over the left zygomatic arch and into the left cheek extending to the left parotid is presumably post-traumatic and compatible with contusion. 3.  Small left parietal scalp contusion.  CT ORBITS WITHOUT CONTRAST  Technique:  Multidetector CT imaging of the orbits was performed following the standard protocol without intravenous contrast.  Findings:  There is no orbital fracture.  Marker overlies the right side of face.  Orbital floor and medial orbital wall appears intact.  Ocular  changes as described above.  Right periorbital subcutaneous hematoma.  No retro bulb are or intraconal hematoma. Globes appear intact.  Marked soft tissue swelling is present in the left cheek extending to the left parotid, partially visualized. Presumably this is post-traumatic.  IMPRESSION:  Negative for orbital fracture.  CT CERVICAL SPINE  Findings: Reversal of the normal cervical lordosis is present. Apex of the reversal is at C5-C6.  There is no cervical spine fracture or dislocation.  Severe facet arthrosis and degenerative disc disease is present most pronounced at C4-C5 through C5-C6. Craniocervical alignment is within normal limits.  Odontoid is intact.  Mastoid air cells appear within normal limits.  The degenerative changes with mild thickening of the transverse ligament of the atlas and calcification.  Carotid atherosclerosis. 3 mm anterolisthesis of C4 ounce C5 appears degenerative, associated with facet arthrosis.  Mild to moderate bony central stenosis at C4-C5 and C5-C6.  Multilevel foraminal stenosis, generally worse on the left.  IMPRESSION: No acute abnormality.  No cervical spine  fracture or dislocation. Moderate to severe multilevel cervical spondylosis.  Original Report Authenticated By: Andreas Newport, M.D.   Ct Cervical Spine Wo Contrast  06/18/2012  *RADIOLOGY REPORT*  Clinical Data:  Found on floor.  Unwitnessed fall.  Found down. Question loss of consciousness.  Right periorbital hematoma. Headache.  CT HEAD WITHOUT CONTRAST CT CERVICAL SPINE WITHOUT CONTRAST  Technique:  Multidetector CT imaging of the head and cervical spine was performed following the standard protocol without intravenous contrast.  Multiplanar CT image reconstructions of the cervical spine were also generated.  Comparison:  CT head 06/18/2012.  CT HEAD  Findings: Multiple areas of face and scalp soft tissue swelling are present.  There is a soft tissue hematoma lateral to the right orbital rim measuring 21 mm x 13 mm (image 6 series 3).  Right ocular banding.  Bilateral lens extractions noted. There is no skull fracture.  Left parietal scalp soft tissue stranding compatible with contusion.  Stranding is present in the soft tissues overlying the left zygomatic arch, also compatible with contusion.  No mass lesion, mass effect, midline shift, hydrocephalus, hemorrhage.  No acute territorial cortical ischemia/infarct. Atrophy and chronic ischemic white matter disease is present. Left parietal and posterior temporal encephalomalacia are unchanged compared to prior, better seen on previous study.  IMPRESSION:  1.  No acute intracranial abnormality. 2.  Right periorbital subcutaneous hematoma. Marked swelling over the left zygomatic arch and into the left cheek extending to the left parotid is presumably post-traumatic and compatible with contusion. 3.  Small left parietal scalp contusion.  CT ORBITS WITHOUT CONTRAST  Technique:  Multidetector CT imaging of the orbits was performed following the standard protocol without intravenous contrast.  Findings:  There is no orbital fracture.  Marker overlies the right side  of face.  Orbital floor and medial orbital wall appears intact.  Ocular changes as described above.  Right periorbital subcutaneous hematoma.  No retro bulb are or intraconal hematoma. Globes appear intact.  Marked soft tissue swelling is present in the left cheek extending to the left parotid, partially visualized. Presumably this is post-traumatic.  IMPRESSION:  Negative for orbital fracture.  CT CERVICAL SPINE  Findings: Reversal of the normal cervical lordosis is present. Apex of the reversal is at C5-C6.  There is no cervical spine fracture or dislocation.  Severe facet arthrosis and degenerative disc disease is present most pronounced at C4-C5 through C5-C6. Craniocervical alignment is within normal limits.  Odontoid is intact.  Mastoid  air cells appear within normal limits.  The degenerative changes with mild thickening of the transverse ligament of the atlas and calcification.  Carotid atherosclerosis. 3 mm anterolisthesis of C4 ounce C5 appears degenerative, associated with facet arthrosis.  Mild to moderate bony central stenosis at C4-C5 and C5-C6.  Multilevel foraminal stenosis, generally worse on the left.  IMPRESSION: No acute abnormality.  No cervical spine fracture or dislocation. Moderate to severe multilevel cervical spondylosis.  Original Report Authenticated By: Andreas Newport, M.D.   Ct Maxillofacial Wo Cm  06/18/2012  *RADIOLOGY REPORT*  Clinical Data: Fall, left sided face bruising, right eye swollen shut.  CT MAXILLOFACIAL WITHOUT CONTRAST  Technique:  Multidetector CT imaging of the maxillofacial structures was performed. Multiplanar CT image reconstructions were also generated.  Comparison: Head CT same day  Findings: There is a small hematoma lateral to the right orbit and superior to the ygomatic arch (image 65, series 3).  There is a large hematoma within the left muscles of mastication lateral the angled jaw measuring 2.5 cm in thickness compared to the normal right side which  measures 1.3 cm in thickness.  There is subcutaneus stranding and edema in the soft tissues of the left face adjacent to the mandible.  There is some stranding in the parapharyngeal space on the left additionally.  No evidence of orbital fracture.  The zygoma are intact.  No evidence of maxillary fracture or pterygoid plate fracture.  No fluid in the paranasal sinuses are frontal sinus.  Nasal bones are intact.  The mandibular condyles are located.  No evidence of mandible fracture.  IMPRESSION:  1.  A large hematoma within the left muscles of mastication. 2.  Extensive subcutaneous edema and likely hemorrhage within the soft tissues of the left face adjacent to of the angle jaw extending into the parapharyngeal space. 3.  No evidence of facial fracture. 4.  Smaller left hematoma lateral to the right lateral orbit.  Original Report Authenticated By: Genevive Bi, M.D.   Ct Orbitss W/o Cm  06/18/2012  *RADIOLOGY REPORT*  Clinical Data:  Found on floor.  Unwitnessed fall.  Found down. Question loss of consciousness.  Right periorbital hematoma. Headache.  CT HEAD WITHOUT CONTRAST CT CERVICAL SPINE WITHOUT CONTRAST  Technique:  Multidetector CT imaging of the head and cervical spine was performed following the standard protocol without intravenous contrast.  Multiplanar CT image reconstructions of the cervical spine were also generated.  Comparison:  CT head 06/18/2012.  CT HEAD  Findings: Multiple areas of face and scalp soft tissue swelling are present.  There is a soft tissue hematoma lateral to the right orbital rim measuring 21 mm x 13 mm (image 6 series 3).  Right ocular banding.  Bilateral lens extractions noted. There is no skull fracture.  Left parietal scalp soft tissue stranding compatible with contusion.  Stranding is present in the soft tissues overlying the left zygomatic arch, also compatible with contusion.  No mass lesion, mass effect, midline shift, hydrocephalus, hemorrhage.  No acute  territorial cortical ischemia/infarct. Atrophy and chronic ischemic white matter disease is present. Left parietal and posterior temporal encephalomalacia are unchanged compared to prior, better seen on previous study.  IMPRESSION:  1.  No acute intracranial abnormality. 2.  Right periorbital subcutaneous hematoma. Marked swelling over the left zygomatic arch and into the left cheek extending to the left parotid is presumably post-traumatic and compatible with contusion. 3.  Small left parietal scalp contusion.  CT ORBITS WITHOUT CONTRAST  Technique:  Multidetector CT  imaging of the orbits was performed following the standard protocol without intravenous contrast.  Findings:  There is no orbital fracture.  Marker overlies the right side of face.  Orbital floor and medial orbital wall appears intact.  Ocular changes as described above.  Right periorbital subcutaneous hematoma.  No retro bulb are or intraconal hematoma. Globes appear intact.  Marked soft tissue swelling is present in the left cheek extending to the left parotid, partially visualized. Presumably this is post-traumatic.  IMPRESSION:  Negative for orbital fracture.  CT CERVICAL SPINE  Findings: Reversal of the normal cervical lordosis is present. Apex of the reversal is at C5-C6.  There is no cervical spine fracture or dislocation.  Severe facet arthrosis and degenerative disc disease is present most pronounced at C4-C5 through C5-C6. Craniocervical alignment is within normal limits.  Odontoid is intact.  Mastoid air cells appear within normal limits.  The degenerative changes with mild thickening of the transverse ligament of the atlas and calcification.  Carotid atherosclerosis. 3 mm anterolisthesis of C4 ounce C5 appears degenerative, associated with facet arthrosis.  Mild to moderate bony central stenosis at C4-C5 and C5-C6.  Multilevel foraminal stenosis, generally worse on the left.  IMPRESSION: No acute abnormality.  No cervical spine fracture or  dislocation. Moderate to severe multilevel cervical spondylosis.  Original Report Authenticated By: Andreas Newport, M.D.    Scheduled Meds:    . docusate sodium  100 mg Oral BID  . donepezil  5 mg Oral QHS  . feeding supplement  1 Container Oral TID BM  . ferrous sulfate  220 mg Oral BID WC  . fesoterodine  4 mg Oral Daily  . mirabegron ER  25 mg Oral QHS  . polyethylene glycol  17 g Oral Daily  . senna-docusate  2 tablet Oral QHS  . sodium chloride  3 mL Intravenous Q12H  . timolol  1 drop Both Eyes QHS  . traZODone  100 mg Oral Once  . Vitamin D (Ergocalciferol)  50,000 Units Oral Q30 days  . DISCONTD: cefTRIAXone (ROCEPHIN)  IV  1 g Intravenous Q24H   Continuous Infusions:   PRN Meds:.alum & mag hydroxide-simeth, bisacodyl, bisacodyl, ondansetron (ZOFRAN) IV, ondansetron, oxyCODONE-acetaminophen, DISCONTD: HYDROcodone-acetaminophen

## 2012-06-22 NOTE — Progress Notes (Signed)
Presented bed offers to pt.  She prefers [name].  SNF will have bed ready for pt tomorrow. Grandson and son updated and agreeable as well.  Will f/u in am.   [name], LCSWA [phone]  Clinical Social Work

## 2012-06-22 NOTE — Progress Notes (Signed)
Physical Therapy Treatment Patient Details Name: [name] MRN: [id number] DOB: 03-Jul-1922 Today's Date: 06/22/2012 Time: 1308-6578 PT Time Calculation (min): 19 min  PT Assessment / Plan / Recommendation Comments on Treatment Session  Pt. has difficulty accepting weight on R LE due to pain level.   Slow progress at this point.    Follow Up Recommendations  Skilled nursing facility    Barriers to Discharge        Equipment Recommendations  Defer to next venue    Recommendations for Other Services    Frequency Min 5X/week   Plan      Precautions / Restrictions Precautions Precautions: Fall Restrictions Weight Bearing Restrictions: Yes RLE Weight Bearing: Weight bearing as tolerated LLE Weight Bearing: Weight bearing as tolerated   Pertinent Vitals/Pain Pain 5/10 R pelvis, RN made aware and repositioned.  Pt. Left in recliner chair reporting comfortable at end of session.    Mobility  Bed Mobility Bed Mobility: Supine to Sit;Sitting - Scoot to Edge of Bed Supine to Sit: 1: +2 Total assist;HOB flat Supine to Sit: Patient Percentage: 40% Sitting - Scoot to Edge of Bed: 3: Mod assist Details for Bed Mobility Assistance: Bed pad utilized to assist pt. to sitting and in scooting to EOB.  She needs cues on how to attempt to assist self and for safety and technique. Transfers Transfers: Sit to Stand;Stand to Sit Sit to Stand: 1: +2 Total assist;With upper extremity assist;From bed Sit to Stand: Patient Percentage: 60% Stand to Sit: 1: +2 Total assist;With upper extremity assist;With armrests;To chair/3-in-1 Stand to Sit: Patient Percentage: 60% Details for Transfer Assistance: Assist to rise to stand and for encouraging weight to be taken through UEs to allow for foot movement..   Ambulation/Gait Ambulation/Gait Assistance: 1: +2 Total assist Ambulation/Gait: Patient Percentage: 60% Ambulation Distance (Feet): 3 Feet Assistive device: Rolling walker Ambulation/Gait  Assistance Details: Pt. had a very difficult time in transferring weight onto arms to allow for moving feet. Needed facilitation to begin to take weight in arms. Also assist to advance RW. Gait Pattern: Step-to pattern;Decreased stance time - right;Decreased step length - left;Shuffle;Antalgic;Trunk flexed    Exercises     PT Diagnosis:    PT Problem List:   PT Treatment Interventions:     PT Goals Acute Rehab PT Goals PT Goal: Supine/Side to Sit - Progress: Progressing toward goal PT Goal: Sit to Stand - Progress: Progressing toward goal PT Goal: Stand to Sit - Progress: Progressing toward goal PT Transfer Goal: Bed to Chair/Chair to Bed - Progress: Goal set today PT Goal: Ambulate - Progress: Progressing toward goal  Visit Information  Last PT Received On: 06/22/12 Assistance Needed: +2    Subjective Data  Subjective: It doesn't hurt alot till I move.   Cognition  Overall Cognitive Status: Appears within functional limits for tasks assessed/performed Arousal/Alertness: Awake/alert Orientation Level: Appears intact for tasks assessed Behavior During Session: [facility] for tasks performed Cognition - Other Comments: pt. made same comment several times regarding the ortho MD to come to decide if seh needs surgery.  Appears to have short term memory deficits.    Balance     End of Session PT - End of Session Equipment Utilized During Treatment: Gait belt Activity Tolerance: Patient limited by pain Patient left: in chair;with call bell/phone within reach Nurse Communication: Mobility status;Weight bearing status   GP     Ferman Hamming 06/22/2012, 10:00 AM Acute Rehabilitation Services [phone] (pager)

## 2012-06-22 NOTE — Progress Notes (Signed)
Patient is having frequent small  urination, bladder scan suggest she is retaining urine. Bladder scan result of 746 ml  After 6 frequent urination. Continue to monitor.

## 2012-06-22 NOTE — Clinical Documentation Improvement (Signed)
GENERIC DOCUMENTATION CLARIFICATION QUERY  THIS DOCUMENT IS NOT A PERMANENT PART OF THE MEDICAL RECORD  TO RESPOND TO THE THIS QUERY, FOLLOW THE INSTRUCTIONS BELOW:  1. If needed, update documentation for the patient's encounter via the notes activity.  2. Access this query again and click edit on the In [employer].  3. After updating, or not, click F2 to complete all highlighted (required) fields concerning your review. Select "additional documentation in the medical record" OR "no additional documentation provided".  4. Click Sign note button.  5. The deficiency will fall out of your In Basket *Please let us know if you are not able to complete this workflow by phone or e-mail (listed below).  Please update your documentation within the medical record to reflect your response to this query.                                                                                        06/22/12   Dear Dr.Elgergawy / Associates,  In a better effort to capture your patient's severity of illness, reflect appropriate length of stay and utilization of resources, a review of the patient medical record has revealed the following indicators.    Based on your clinical judgment, please clarify and document in a progress note and/or discharge summary the clinical condition associated with the following supporting information:  In responding to this query please exercise your independent judgment.  The fact that a query is asked, does not imply that any particular answer is desired or expected.  Possible Clinical Conditions?  _______Acute Right Obturator Ring Fracture  _______Other Condition  _______Cannot Clinically Determine    Supporting Information:  Risk Factors: Noted 06/18/12 ED notes and admit note: Patient admitted with Syncope on 06/18/12; found lying down on floor. Patient reports that she tripped over a folding table and fell; apparently hit her head. Patient denies LOC, no focal neuro  deficits noted. No indication of arrhythmic syncope.  Acute right obturator ring fracture noted per 7/27 progress notes. Please clarify, in the progress notes and discharge summary, if Syncope remains primary admitting diagnosis.   You may use possible, probable, or suspect with inpatient documentation. possible, probable, suspected diagnoses MUST be documented at the time of discharge  Reviewed:Additional documentation provided per 7/30 progress notes.  Thank You,  Marciano Sequin,  Clinical Documentation Specialist:  Pager: [phone]  Health Information Management [place]

## 2012-06-22 NOTE — Progress Notes (Signed)
Patient ID: [name], female   DOB: [birth year], 78 y.o.   MRN: [id number]   CTSP for retention.  Her foley was removed this morning and she has been unable to void.   A bladder scan showed 800cc and her bladder is very distended.   Attempts at foley placement were unsuccessful.  I was able to place a 61fr coude foley with considerable aid from the staff to maintain lithotomy position.  Her meatus was eventually visualized using the sponge stick gently as a speculum.   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   Once the foley was placed the balloon was filled with 10cc of fluid.   She drained several hundred cc of clear urine.  Imp: Urinary retention with difficult foley placement.  Rec:   She will need f/u with Dr. Vernie Ammons in 3-4 weeks and should be maintained on catheter drainage until then.

## 2012-06-23 LAB — BASIC METABOLIC PANEL
BUN: 19 mg/dL (ref 6–23)
CO2: 27 mEq/L (ref 19–32)
Chloride: 100 mEq/L (ref 96–112)
Creatinine, Ser: 0.65 mg/dL (ref 0.50–1.10)

## 2012-06-23 LAB — CBC
HCT: 26.9 % — ABNORMAL LOW (ref 36.0–46.0)
MCV: 93.1 fL (ref 78.0–100.0)
RBC: 2.89 MIL/uL — ABNORMAL LOW (ref 3.87–5.11)
RDW: 14 % (ref 11.5–15.5)
WBC: 6.8 10*3/uL (ref 4.0–10.5)

## 2012-06-23 MED ORDER — HYDRALAZINE HCL 20 MG/ML IJ SOLN
2.0000 mg | INTRAMUSCULAR | Status: DC | PRN
  Administered 2012-06-23: 2 mg via INTRAVENOUS
  Filled 2012-06-23: qty 0.1

## 2012-06-23 MED ORDER — POLYETHYLENE GLYCOL 3350 17 G PO PACK: 17.0000 g | PACK | Freq: Every day | ORAL | Status: AC

## 2012-06-23 MED ORDER — ALUM & MAG HYDROXIDE-SIMETH 200-200-20 MG/5ML PO SUSP: 30.0000 mL | Freq: Four times a day (QID) | ORAL | Status: AC | PRN

## 2012-06-23 MED ORDER — ENSURE PUDDING PO PUDG: 1.0000 | Freq: Three times a day (TID) | ORAL | Status: DC

## 2012-06-23 MED ORDER — BISACODYL 5 MG PO TBEC: 5.0000 mg | DELAYED_RELEASE_TABLET | Freq: Two times a day (BID) | ORAL | Status: AC | PRN

## 2012-06-23 MED ORDER — AMLODIPINE BESYLATE 5 MG PO TABS
5.0000 mg | ORAL_TABLET | Freq: Every day | ORAL | Status: DC
  Administered 2012-06-23: 5 mg via ORAL
  Filled 2012-06-23: qty 1

## 2012-06-23 MED ORDER — CARVEDILOL 3.125 MG PO TABS
3.1250 mg | ORAL_TABLET | Freq: Two times a day (BID) | ORAL | Status: DC
  Filled 2012-06-23 (×2): qty 1

## 2012-06-23 MED ORDER — OXYCODONE-ACETAMINOPHEN 5-325 MG PO TABS: 1.0000 | ORAL_TABLET | Freq: Four times a day (QID) | ORAL | Status: AC | PRN

## 2012-06-23 MED ORDER — CARVEDILOL 3.125 MG PO TABS: 3.1250 mg | ORAL_TABLET | Freq: Two times a day (BID) | ORAL | Status: AC

## 2012-06-23 MED ORDER — FERROUS SULFATE 220 (44 FE) MG/5ML PO ELIX: 220.0000 mg | ORAL_SOLUTION | Freq: Two times a day (BID) | ORAL | Status: DC

## 2012-06-23 MED ORDER — AMLODIPINE BESYLATE 5 MG PO TABS: 5.0000 mg | ORAL_TABLET | Freq: Every day | ORAL | Status: AC

## 2012-06-23 MED ORDER — SENNOSIDES-DOCUSATE SODIUM 8.6-50 MG PO TABS: 2.0000 | ORAL_TABLET | Freq: Every day | ORAL | Status: AC

## 2012-06-23 NOTE — Clinical Social Work Placement (Signed)
     Clinical Social Work Department CLINICAL SOCIAL WORK PLACEMENT NOTE 06/23/2012  Patient:  [name], [name]  Account Number:  [id number] Admit date:  06/18/2012  Clinical Social Worker:  Dellie Burns, Theresia Majors  Date/time:  06/21/2012 12:00 N  Clinical Social Work is seeking post-discharge placement for this patient at the following level of care:   SKILLED NURSING   (*CSW will update this form in Epic as items are completed)   06/21/2012  Patient/family provided with [facility] Department of Clinical Social Works list of facilities offering this level of care within the geographic area requested by the patient (or if unable, by the patients family).  06/21/2012  Patient/family informed of their freedom to choose among providers that offer the needed level of care, that participate in Medicare, Medicaid or managed care program needed by the patient, have an available bed and are willing to accept the patient.  06/21/2012  Patient/family informed of [facility] ownership interest in [facility] as well as of the fact that they are under no obligation to receive care at this facility.  PASARR submitted to [employer] on 06/21/2012 PASARR number received from [employer] on 06/21/2012  FL2 transmitted to all facilities in geographic area requested by pt/family on  06/21/2012 FL2 transmitted to all facilities within larger geographic area on   Patient informed that his/her managed care company has contracts with or will negotiate with  certain facilities, including the following:     Patient/family informed of bed offers received:  06/22/2012 Patient chooses bed at [facility] Physician recommends and patient chooses bed at    Patient to be transferred to [facility] on  06/23/2012 Patient to be transferred to facility by EMS  The following physician request were entered in Epic:   Additional Comments: Pt with existing PASARR #

## 2012-06-23 NOTE — Discharge Summary (Signed)
[facility]                                                                                   [name], is a 78 y.o. female  DOB [birth year]  MRN [id number].  Admission date:  06/18/2012  Discharge Date:  06/23/2012  Primary MD  [name], MD  Admitting Physician  Huey Bienenstock, MD  Admission Diagnosis  Unspecified essential hypertension [401.9] (UNSPECIFIED ESSENTIAL HYPERTENSION) Coronary atherosclerosis of unspecified type of vessel, native or graft [414.00] (CORONARY ATHEROSCLEROSIS OF UNSPECIFIED TYPE OF VESSEL) Syncope and collapse [780.2] Memory loss [780.93] Fall, Syncope 206001   Discharge Diagnosis     Principal Problem:  *Syncope Active Problems:  HYPERLIPIDEMIA  HYPERTENSION  CORONARY ARTERY DISEASE  Memory loss  Hx of cardiac pacemaker, medtronic for tachy/brady syndrome  Anemia  Pelvic fracture  Urinary retention      Past Medical History  Diagnosis Date  . CAD (coronary artery disease)   . Headache   . Hyperlipidemia   . Hypertension   . Osteoporosis   . Pacemaker   . Hx of cardiac pacemaker, medtronic for tachy/brady syndrome 06/19/2012    Past Surgical History  Procedure Date  . Total hip arthroplasty   . Orif hip fracture   . Lumbar laminectomy   . Lumbar fusion   . Cardiac pacemaker placement   . Coronary angioplasty with stent placement   . Tonsillectomy      Recommendations for primary care physician for things to follow:   Follow CBC in 3 days. X2, and if stable may resume Plavix. Please adjust blood pressure medications accordingly given , patient was resumed on Norvasc and Coreg prior to discharge  Discharge Diagnoses:   Principal Problem:  *Syncope Active Problems:  HYPERLIPIDEMIA  HYPERTENSION  CORONARY ARTERY DISEASE  Memory loss  Hx of cardiac pacemaker, medtronic for tachy/brady syndrome  Anemia  Pelvic fracture  Urinary retention    Discharge Condition: stable   Diet  recommendation: See Discharge Instructions below Heart healthy    Procedures:  Pacemaker interrogation  Echo Consultations:  Cardiology Corinda Gubler  Urology/Dr Grapey  Orthopedic/ Dr Sherlean Foot PT consult     History of present illness and  Hospital Course:  See H&P, Labs, Consult and Test reports for all details in brief,    History of present illness:  [name] is a 78 y.o. female, with past medical history of coronary artery disease, tachybrady syndrome status post permanent pacemaker,Been followed by [facility] cardiology, patient presents today with syncope, patient reports yesterday night she went to go to the bathroom where she felt weak and her legs buckled and she fell on the floor, but she denies any loss of consciousness during that episode where she reports she still up and went back to her, patient reports she went another time to the bathroom over the night where she reports this time and again she she felt weak and fell, where she called her grandson who came and found her on the kitchen floor, and called EMS, the grandson reports as well while he was there she did loss consciousness couple of episodes as well,  Hospital Course:  Patient was  admitted for syncope evaluation, was admitted to telemetry floor.  1. Syncope. Most likely due to orthostasis , so all blood pressure medications were held , patient was hydrated with IV fluid .No evidence of arrhythmia on telemetry monitor, patient rhythm is paced, cardiology consult appreciated, normal EF on echo, no arrhythmias on pacemaker interrogation, had negative troponins x3, carotid Doppler show No significant stenosis., continue on fall precautions.   2. Acute right obturator ring fracture: Orthopedic Dr. Sherlean Foot was consulted, no surgical indication cont PT, weight bearing as tolerated  Will discharge to SNF for rehabilitation.  3. Anemia. Patient is status post fall with significant facial and neck and upper chest hematoma and  bruising, is Hemoccult negative., Her anemia thought to be secondary to her significant hematoma and bruising, aspirin and Plavix and anticoagulation were held, will continue to hold still she is more stable, started on iron supplement.  4.UTI. Finished 4 days of rocephin, urine cx showing no growth.   5. Coronary artery disease. No complaints of chest pain or shortness of breath ,holding plavix and aspirin due to significant bruising with anemia.Marland Kitchen  7. Urinary retention. Patient had retention, was hard to insert Foley, probably had to be inserted by urology, had a voiding trial which she filled it, so Foley had to be reinserted, recommendation for now to continue with Foley, and follow as an outpatient with Dr. Vernie Ammons from [facility] in 4 weeks for an outpatient voiding trial.    Today   Subjective:   [name] today has no headache,no chest abdominal pain,no new weakness tingling or numbness, feels much better  Objective:   Blood pressure 190/58, pulse 72, temperature 98.5 F (36.9 C), temperature source Oral, resp. rate 20, height 5\' 2"  (1.575 m), weight 58.106 kg (128 lb 1.6 oz), SpO2 92.00%.   Intake/Output Summary (Last 24 hours) at 06/23/12 1028 Last data filed at 06/23/12 0358  Gross per 24 hour  Intake    480 ml  Output   2240 ml  Net  -1760 ml   Exam  1. Generalelderly female lying in bed in NAD,  2. Normal affect and insight, Not Suicidal or Homicidal, Awake Alert, Oriented X 3.  3. No F.N deficits, ALL C.Nerves Intact, Strength 5/5 all 4 extremities, Sensation intact all 4 extremities, Plantars down going.  4. Ears and Eyes appear Normal, Conjunctivae clear, right eye and he anisocoria . Moist Oral Mucosa.Right lateral orbital hematoma multiple facial bruises mainly in the left neck chen and surrounding ear area and upper chest.  5. Supple Neck, No JVD, No cervical lymphadenopathy appriciated, No Carotid Bruits.  6. Symmetrical Chest wall movement, Good air  movement bilaterally, CTAB.  7. RRR, No Gallops, Rubs or Murmurs, No Parasternal Heave.  8. Positive Bowel Sounds, Abdomen Soft, Non tender, No organomegaly appriciated,No rebound -guarding or rigidity.  9. No Cyanosis, Normal Skin Turgor, No Skin Rash or Bruise.  10. Good muscle tone, joints appear normal , no effusions, ROM decreased due to pain in right lower ext.       Data Review   Major procedures and Radiology Reports - PLEASE review detailed and final reports for all details in brief -      Dg Chest 1 View  06/18/2012  *RADIOLOGY REPORT*  Clinical Data: Hip/pelvic fracture, preoperative assessment  CHEST - 1 VIEW  Comparison: 01/21/2011  Findings: Left subclavian sequential transvenous pacemaker leads project at right atrium and right ventricle. Enlargement of cardiac silhouette. Calcified tortuous aorta. Pulmonary vascularity normal. Lungs  clear. Question calcified granulomata projecting over right lung. No pleural effusion or pneumothorax. Compression fracture mid thoracic vertebra with evidence of prior spinal augmentation procedure.  IMPRESSION: Enlargement of cardiac silhouette post pacemaker. No acute abnormalities.  Original Report Authenticated By: Lollie Marrow, M.D.   Dg Hip Complete Right  06/18/2012  *RADIOLOGY REPORT*  Clinical Data: Fall.  Right hip pain.  RIGHT HIP - COMPLETE 2+ VIEW  Comparison: 06/05/2010.  Findings: There are new right parasymphyseal pubic fractures which appear acute, with sharply marginated edges.  Probable complimentary fracture involving the mid right inferior pubic ramus.  The sacral arcades appear intact.  The left obturator ring is intact. Iliac artery atherosclerosis.  Right total hip arthroplasty is present with unchanged displacement of the lesser trochanter from old fracture.  IMPRESSION: Acute nondisplaced right obturator ring fractures.  No contralateral obturator ring or sacral fracture identified.  Original Report Authenticated By:  Andreas Newport, M.D.   Ct Head Wo Contrast  06/18/2012  *RADIOLOGY REPORT*  Clinical Data:  Found on floor.  Unwitnessed fall.  Found down. Question loss of consciousness.  Right periorbital hematoma. Headache.  CT HEAD WITHOUT CONTRAST CT CERVICAL SPINE WITHOUT CONTRAST  Technique:  Multidetector CT imaging of the head and cervical spine was performed following the standard protocol without intravenous contrast.  Multiplanar CT image reconstructions of the cervical spine were also generated.  Comparison:  CT head 06/18/2012.  CT HEAD  Findings: Multiple areas of face and scalp soft tissue swelling are present.  There is a soft tissue hematoma lateral to the right orbital rim measuring 21 mm x 13 mm (image 6 series 3).  Right ocular banding.  Bilateral lens extractions noted. There is no skull fracture.  Left parietal scalp soft tissue stranding compatible with contusion.  Stranding is present in the soft tissues overlying the left zygomatic arch, also compatible with contusion.  No mass lesion, mass effect, midline shift, hydrocephalus, hemorrhage.  No acute territorial cortical ischemia/infarct. Atrophy and chronic ischemic white matter disease is present. Left parietal and posterior temporal encephalomalacia are unchanged compared to prior, better seen on previous study.  IMPRESSION:  1.  No acute intracranial abnormality. 2.  Right periorbital subcutaneous hematoma. Marked swelling over the left zygomatic arch and into the left cheek extending to the left parotid is presumably post-traumatic and compatible with contusion. 3.  Small left parietal scalp contusion.  CT ORBITS WITHOUT CONTRAST  Technique:  Multidetector CT imaging of the orbits was performed following the standard protocol without intravenous contrast.  Findings:  There is no orbital fracture.  Marker overlies the right side of face.  Orbital floor and medial orbital wall appears intact.  Ocular changes as described above.  Right periorbital  subcutaneous hematoma.  No retro bulb are or intraconal hematoma. Globes appear intact.  Marked soft tissue swelling is present in the left cheek extending to the left parotid, partially visualized. Presumably this is post-traumatic.  IMPRESSION:  Negative for orbital fracture.  CT CERVICAL SPINE  Findings: Reversal of the normal cervical lordosis is present. Apex of the reversal is at C5-C6.  There is no cervical spine fracture or dislocation.  Severe facet arthrosis and degenerative disc disease is present most pronounced at C4-C5 through C5-C6. Craniocervical alignment is within normal limits.  Odontoid is intact.  Mastoid air cells appear within normal limits.  The degenerative changes with mild thickening of the transverse ligament of the atlas and calcification.  Carotid atherosclerosis. 3 mm anterolisthesis of C4 ounce C5 appears  degenerative, associated with facet arthrosis.  Mild to moderate bony central stenosis at C4-C5 and C5-C6.  Multilevel foraminal stenosis, generally worse on the left.  IMPRESSION: No acute abnormality.  No cervical spine fracture or dislocation. Moderate to severe multilevel cervical spondylosis.  Original Report Authenticated By: Andreas Newport, M.D.   Ct Cervical Spine Wo Contrast  06/18/2012  *RADIOLOGY REPORT*  Clinical Data:  Found on floor.  Unwitnessed fall.  Found down. Question loss of consciousness.  Right periorbital hematoma. Headache.  CT HEAD WITHOUT CONTRAST CT CERVICAL SPINE WITHOUT CONTRAST  Technique:  Multidetector CT imaging of the head and cervical spine was performed following the standard protocol without intravenous contrast.  Multiplanar CT image reconstructions of the cervical spine were also generated.  Comparison:  CT head 06/18/2012.  CT HEAD  Findings: Multiple areas of face and scalp soft tissue swelling are present.  There is a soft tissue hematoma lateral to the right orbital rim measuring 21 mm x 13 mm (image 6 series 3).  Right ocular banding.   Bilateral lens extractions noted. There is no skull fracture.  Left parietal scalp soft tissue stranding compatible with contusion.  Stranding is present in the soft tissues overlying the left zygomatic arch, also compatible with contusion.  No mass lesion, mass effect, midline shift, hydrocephalus, hemorrhage.  No acute territorial cortical ischemia/infarct. Atrophy and chronic ischemic white matter disease is present. Left parietal and posterior temporal encephalomalacia are unchanged compared to prior, better seen on previous study.  IMPRESSION:  1.  No acute intracranial abnormality. 2.  Right periorbital subcutaneous hematoma. Marked swelling over the left zygomatic arch and into the left cheek extending to the left parotid is presumably post-traumatic and compatible with contusion. 3.  Small left parietal scalp contusion.  CT ORBITS WITHOUT CONTRAST  Technique:  Multidetector CT imaging of the orbits was performed following the standard protocol without intravenous contrast.  Findings:  There is no orbital fracture.  Marker overlies the right side of face.  Orbital floor and medial orbital wall appears intact.  Ocular changes as described above.  Right periorbital subcutaneous hematoma.  No retro bulb are or intraconal hematoma. Globes appear intact.  Marked soft tissue swelling is present in the left cheek extending to the left parotid, partially visualized. Presumably this is post-traumatic.  IMPRESSION:  Negative for orbital fracture.  CT CERVICAL SPINE  Findings: Reversal of the normal cervical lordosis is present. Apex of the reversal is at C5-C6.  There is no cervical spine fracture or dislocation.  Severe facet arthrosis and degenerative disc disease is present most pronounced at C4-C5 through C5-C6. Craniocervical alignment is within normal limits.  Odontoid is intact.  Mastoid air cells appear within normal limits.  The degenerative changes with mild thickening of the transverse ligament of the atlas  and calcification.  Carotid atherosclerosis. 3 mm anterolisthesis of C4 ounce C5 appears degenerative, associated with facet arthrosis.  Mild to moderate bony central stenosis at C4-C5 and C5-C6.  Multilevel foraminal stenosis, generally worse on the left.  IMPRESSION: No acute abnormality.  No cervical spine fracture or dislocation. Moderate to severe multilevel cervical spondylosis.  Original Report Authenticated By: Andreas Newport, M.D.   Ct Maxillofacial Wo Cm  06/18/2012  *RADIOLOGY REPORT*  Clinical Data: Fall, left sided face bruising, right eye swollen shut.  CT MAXILLOFACIAL WITHOUT CONTRAST  Technique:  Multidetector CT imaging of the maxillofacial structures was performed. Multiplanar CT image reconstructions were also generated.  Comparison: Head CT same day  Findings: There  is a small hematoma lateral to the right orbit and superior to the ygomatic arch (image 65, series 3).  There is a large hematoma within the left muscles of mastication lateral the angled jaw measuring 2.5 cm in thickness compared to the normal right side which measures 1.3 cm in thickness.  There is subcutaneus stranding and edema in the soft tissues of the left face adjacent to the mandible.  There is some stranding in the parapharyngeal space on the left additionally.  No evidence of orbital fracture.  The zygoma are intact.  No evidence of maxillary fracture or pterygoid plate fracture.  No fluid in the paranasal sinuses are frontal sinus.  Nasal bones are intact.  The mandibular condyles are located.  No evidence of mandible fracture.  IMPRESSION:  1.  A large hematoma within the left muscles of mastication. 2.  Extensive subcutaneous edema and likely hemorrhage within the soft tissues of the left face adjacent to of the angle jaw extending into the parapharyngeal space. 3.  No evidence of facial fracture. 4.  Smaller left hematoma lateral to the right lateral orbit.  Original Report Authenticated By: Genevive Bi, M.D.    Ct Orbitss W/o Cm  06/18/2012  *RADIOLOGY REPORT*  Clinical Data:  Found on floor.  Unwitnessed fall.  Found down. Question loss of consciousness.  Right periorbital hematoma. Headache.  CT HEAD WITHOUT CONTRAST CT CERVICAL SPINE WITHOUT CONTRAST  Technique:  Multidetector CT imaging of the head and cervical spine was performed following the standard protocol without intravenous contrast.  Multiplanar CT image reconstructions of the cervical spine were also generated.  Comparison:  CT head 06/18/2012.  CT HEAD  Findings: Multiple areas of face and scalp soft tissue swelling are present.  There is a soft tissue hematoma lateral to the right orbital rim measuring 21 mm x 13 mm (image 6 series 3).  Right ocular banding.  Bilateral lens extractions noted. There is no skull fracture.  Left parietal scalp soft tissue stranding compatible with contusion.  Stranding is present in the soft tissues overlying the left zygomatic arch, also compatible with contusion.  No mass lesion, mass effect, midline shift, hydrocephalus, hemorrhage.  No acute territorial cortical ischemia/infarct. Atrophy and chronic ischemic white matter disease is present. Left parietal and posterior temporal encephalomalacia are unchanged compared to prior, better seen on previous study.  IMPRESSION:  1.  No acute intracranial abnormality. 2.  Right periorbital subcutaneous hematoma. Marked swelling over the left zygomatic arch and into the left cheek extending to the left parotid is presumably post-traumatic and compatible with contusion. 3.  Small left parietal scalp contusion.  CT ORBITS WITHOUT CONTRAST  Technique:  Multidetector CT imaging of the orbits was performed following the standard protocol without intravenous contrast.  Findings:  There is no orbital fracture.  Marker overlies the right side of face.  Orbital floor and medial orbital wall appears intact.  Ocular changes as described above.  Right periorbital subcutaneous hematoma.  No  retro bulb are or intraconal hematoma. Globes appear intact.  Marked soft tissue swelling is present in the left cheek extending to the left parotid, partially visualized. Presumably this is post-traumatic.  IMPRESSION:  Negative for orbital fracture.  CT CERVICAL SPINE  Findings: Reversal of the normal cervical lordosis is present. Apex of the reversal is at C5-C6.  There is no cervical spine fracture or dislocation.  Severe facet arthrosis and degenerative disc disease is present most pronounced at C4-C5 through C5-C6. Craniocervical alignment is within normal  limits.  Odontoid is intact.  Mastoid air cells appear within normal limits.  The degenerative changes with mild thickening of the transverse ligament of the atlas and calcification.  Carotid atherosclerosis. 3 mm anterolisthesis of C4 ounce C5 appears degenerative, associated with facet arthrosis.  Mild to moderate bony central stenosis at C4-C5 and C5-C6.  Multilevel foraminal stenosis, generally worse on the left.  IMPRESSION: No acute abnormality.  No cervical spine fracture or dislocation. Moderate to severe multilevel cervical spondylosis.  Original Report Authenticated By: Andreas Newport, M.D.    Micro Results     Recent Results (from the past 240 hour(s))  URINE CULTURE     Status: Normal   Collection Time   06/19/12  6:20 PM      Component Value Range Status Comment   Specimen Description URINE, CATHETERIZED   Final    Special Requests PATIENT ON FOLLOWING ROCEPHIN   Final    Culture  Setup Time 06/20/2012 01:49   Final    Colony Count NO GROWTH   Final    Culture NO GROWTH   Final    Report Status 06/21/2012 FINAL   Final      CBC w Diff: Lab Results  Component Value Date   WBC 6.8 06/23/2012   HGB 9.2* 06/23/2012   HCT 26.9* 06/23/2012   PLT 164 06/23/2012   LYMPHOPCT 10* 06/18/2012   MONOPCT 6 06/18/2012   EOSPCT 0 06/18/2012   BASOPCT 0 06/18/2012    CMP: Lab Results  Component Value Date   NA 135 06/23/2012   K 4.1  06/23/2012   CL 100 06/23/2012   CO2 27 06/23/2012   BUN 19 06/23/2012   CREATININE 0.65 06/23/2012   PROT 6.1 10/31/2010   ALBUMIN 3.9 10/31/2010   BILITOT 0.5 10/31/2010   ALKPHOS 59 10/31/2010   AST 27 10/31/2010   ALT 15 10/31/2010  .   Discharge Instructions     Follow with urology Dr. Vernie Ammons from [facility] and 4 weeks for voiding trial  Follow-up Information    Follow up with Garnett Farm, MD. Call in 4 weeks. (call for time and continue foley drainage until the appt.)    Contact information:   [address] [place] [address] [phone]       Follow up with [name], MD. Call in 2 weeks.   Contact information:   [address] [place] [place] [address] [phone]            Discharge Medications   Medication List  As of 06/23/2012 10:28 AM   START taking these medications         alum & mag hydroxide-simeth 200-200-20 MG/5ML suspension   Commonly known as: MAALOX/MYLANTA   Take 30 mLs by mouth every 6 (six) hours as needed (dyspepsia).      bisacodyl 5 MG EC tablet   Commonly known as: DULCOLAX   Take 1 tablet (5 mg total) by mouth 2 (two) times daily as needed.      feeding supplement Pudg   Take 1 Container by mouth 3 (three) times daily between meals.      ferrous sulfate 220 (44 FE) MG/5ML solution   Take 5 mLs (220 mg total) by mouth 2 (two) times daily with a meal.      oxyCODONE-acetaminophen 5-325 MG per tablet   Commonly known as: PERCOCET/ROXICET   Take 1-2 tablets by mouth every 6 (six) hours as  needed.      polyethylene glycol packet   Commonly known as: MIRALAX / GLYCOLAX   Take 17 g by mouth daily.      senna-docusate 8.6-50 MG per tablet   Commonly known as: Senokot-S   Take 2 tablets by mouth at bedtime.         CHANGE how you take these medications         amLODipine 5 MG tablet   Commonly known as: NORVASC   Take 1 tablet (5 mg total) by mouth daily.    What changed: how often to take the med         CONTINUE taking these medications         CALCIUM PO      carvedilol 3.125 MG tablet   Commonly known as: COREG   Take 1 tablet (3.125 mg total) by mouth 2 (two) times daily with a meal.      donepezil 5 MG tablet   Commonly known as: ARICEPT      * ICAPS PO      * multivitamin,tx-minerals tablet      mirabegron ER 25 MG Tb24   Commonly known as: MYRBETRIQ      PROLIA [place]      timolol 0.25 % ophthalmic solution   Commonly known as: BETIMOL      traZODone 100 MG tablet   Commonly known as: DESYREL      Vitamin D3 50000 UNITS Caps     * Notice: This list has 2 medication(s) that are the same as other medications prescribed for you. Read the directions carefully, and ask your doctor or other care provider to review them with you.       STOP taking these medications         aspirin EC 81 MG tablet      celecoxib 200 MG capsule      clopidogrel 75 MG tablet      losartan 100 MG tablet      OVER THE COUNTER MEDICATION          Where to get your medications    These are the prescriptions that you need to pick up.   You may get these medications from any pharmacy.         oxyCODONE-acetaminophen 5-325 MG per tablet         Information on where to get these meds is not yet available. Ask your nurse or doctor.         alum & mag hydroxide-simeth 200-200-20 MG/5ML suspension   amLODipine 5 MG tablet   bisacodyl 5 MG EC tablet   carvedilol 3.125 MG tablet   feeding supplement Pudg   ferrous sulfate 220 (44 FE) MG/5ML solution   polyethylene glycol packet   senna-docusate 8.6-50 MG per tablet               Total Time in preparing paper work, data evaluation and todays exam - 35 minutes  Rushton Early M.D on 06/23/2012 at 10:28 AM  [facility] Office  [phone]

## 2012-06-23 NOTE — Plan of Care (Signed)
Problem: Phase II Progression Outcomes Goal: Obtain order to discontinue catheter if appropriate Outcome: Not Met (add Reason) Will be discharged with Foley

## 2012-06-23 NOTE — Progress Notes (Signed)
Pt for d/c today to SNF bed at [name]. Pt and family agreeable.  Plans confirmed with facility.  Plan transfer via EMS. Vickii Penna, LCSWA [phone]  Clinical Social Work

## 2012-06-23 NOTE — Progress Notes (Signed)
Notified physician on-call of high BP this morning of 190/72.  Received order for 2 mg Hydralazine IV and administered.  BP still at 190/58 when last checked at 0647 manually.  Paged physician on-call again but received no response or new orders.  Will pass on to day shift RN.  Arva Chafe

## 2012-06-23 NOTE — Progress Notes (Signed)
Report called to [facility] at [facility].  Pt to be transported there later this afternoon

## 2012-06-23 NOTE — Progress Notes (Signed)
D/C pending today. No active cardiac issues. Will arrange follow-up in our office with Dr. Royann Shivers.  Chrystie Nose, MD, [facility] Attending Cardiologist [facility]

## 2012-06-23 NOTE — Discharge Summary (Deleted)
Physician Discharge Summary  [name] NWG:[id number] DOB: 1922/03/23 DOA: 06/18/2012  PCP: [name], MD  Admit date: 06/18/2012 Discharge date: 06/23/2012  Recommendations for Outpatient Follow-up:  1.  (include homehealth, outpatient follow-up instructions, specific recommendations for PCP to follow-up on, etc.)  Discharge Diagnoses:  Principal Problem:  *Syncope Active Problems:  HYPERLIPIDEMIA  HYPERTENSION  CORONARY ARTERY DISEASE  Memory loss  Hx of cardiac pacemaker, medtronic for tachy/brady syndrome  Anemia  Pelvic fracture  Urinary retention   Discharge Condition: Stable  Diet recommendation: Heart Healthy  Wt Readings from Last 3 Encounters:  06/18/12 58.106 kg (128 lb 1.6 oz)  06/17/11 56.7 kg (125 lb)  03/13/11 59.194 kg (130 lb 8 oz)    History of present illness:  [name] is a 78 y.o. female, with past medical history of coronary artery disease, tachybrady syndrome status post permanent pacemaker,Been followed by [facility], patient presents today with syncope, patient reports yesterday night she went to go to the bathroom where she felt weak and her legs buckled and she fell on the floor, but she denies any loss of consciousness during that episode where she reports she still up and went back to her, patient reports she went another time to the bathroom over the night where she reports this time and again she she felt weak and fell, where she called her grandson who came and found her on the kitchen floor, and called EMS, the grandson reports as well while he was there she did loss consciousness couple of episodes as well,    Hospital Course:  Patient was admitted for syncope evaluation, was admitted to telemetry floor.  1. Syncope. Most likely due to orthostasis , so all blood pressure medications were held , patient was hydrated with IV fluid .No evidence of arrhythmia on telemetry monitor, patient rhythm is paced, cardiology consult  appreciated, normal EF on echo, no arrhythmias on pacemaker interrogation, had negative troponins x3, carotid Doppler show No significant stenosis., continue on fall precautions.   2. Acute right obturator ring fracture: Orthopedic Dr. Sherlean Foot was consulted, no surgical indication cont PT, weight bearing as tolerated Will discharge to SNF for rehabilitation.  3. Anemia. Patient is status post fall with significant facial and neck and upper chest hematoma and bruising, is Hemoccult negative., Her anemia thought to be secondary to her significant hematoma and bruising, aspirin and Plavix and anticoagulation were held, will continue to hold still she is more stable, started on iron supplement.  4.UTI. Finished 4 days of rocephin, urine cx showing no growth.   5. Coronary artery disease. No complaints of chest pain or shortness of breath ,holding plavix and aspirin due to significant bruising with anemia.Marland Kitchen   7. Urinary retention. Patient had retention, was hard to insert Foley, probably had to be inserted by urology, had a voiding trial which she filled it, so Foley had to be reinserted, recommendation for now to continue with Foley, and follow as an outpatient with Dr. Vernie Ammons from [facility] in 4 weeks for an outpatient voiding trial.  Procedures:  Pacemaker interrogation  Echo  Consultations: Cardiology Corinda Gubler  Urology/Dr Isabel Caprice  Orthopedic/ Dr Sherlean Foot PT consult      Discharge Exam: Filed Vitals:   06/23/12 0647  BP: 190/58  Pulse:   Temp:   Resp:    Filed Vitals:   06/22/12 2004 06/23/12 0408 06/23/12 0632 06/23/12 0647  BP: 149/68 190/72 190/56 190/58  Pulse: 81 72    Temp: 99.5 F (37.5 C)  98.5 F (36.9 C)    TempSrc: Oral Oral    Resp: 20 20    Height:      Weight:      SpO2: 95% 92%      Exam  1. Generalelderly female lying in bed in NAD,  2. Normal affect and insight, Not Suicidal or Homicidal, Awake Alert, Oriented X 3.  3. No F.N deficits, ALL  C.Nerves Intact, Strength 5/5 all 4 extremities, Sensation intact all 4 extremities, Plantars down going.  4. Ears and Eyes appear Normal, Conjunctivae clear, right eye and he anisocoria . Moist Oral Mucosa.Right lateral orbital hematoma multiple facial bruises mainly in the left neck chen and surrounding ear area and upper chest.  5. Supple Neck, No JVD, No cervical lymphadenopathy appriciated, No Carotid Bruits.  6. Symmetrical Chest wall movement, Good air movement bilaterally, CTAB.  7. RRR, No Gallops, Rubs or Murmurs, No Parasternal Heave.  8. Positive Bowel Sounds, Abdomen Soft, Non tender, No organomegaly appriciated,No rebound -guarding or rigidity.  9. No Cyanosis, Normal Skin Turgor, No Skin Rash or Bruise.  10. Good muscle tone, joints appear normal , no effusions, ROM decreased due to pain in right lower ext.      Discharge Instructions  Discharge Orders    Future Orders Please Complete By Expires   Diet - low sodium heart healthy      Increase activity slowly      Discharge instructions      Comments:   Leave foley in due to urinary retention, and follow with DR Vernie Ammons at [facility] in 2 weeks for voiding Trial. Fall Precaution. PT evaluate and treat.     Medication List  As of 06/23/2012  9:03 AM   STOP taking these medications         amLODipine 5 MG tablet      aspirin EC 81 MG tablet      carvedilol 3.125 MG tablet      celecoxib 200 MG capsule      clopidogrel 75 MG tablet      losartan 100 MG tablet      OVER THE COUNTER MEDICATION         TAKE these medications         alum & mag hydroxide-simeth 200-200-20 MG/5ML suspension   Commonly known as: MAALOX/MYLANTA   Take 30 mLs by mouth every 6 (six) hours as needed (dyspepsia).      bisacodyl 5 MG EC tablet   Commonly known as: DULCOLAX   Take 1 tablet (5 mg total) by mouth 2 (two) times daily as needed.      CALCIUM PO   Take 1 tablet by mouth daily.      donepezil 5 MG tablet    Commonly known as: ARICEPT   Take 5 mg by mouth at bedtime.      feeding supplement Pudg   Take 1 Container by mouth 3 (three) times daily between meals.      ferrous sulfate 220 (44 FE) MG/5ML solution   Take 5 mLs (220 mg total) by mouth 2 (two) times daily with a meal.      ICAPS PO   Take 1 tablet by mouth daily.      multivitamin,tx-minerals tablet   Take 1 tablet by mouth daily.      mirabegron ER 25 MG Tb24   Commonly known as: MYRBETRIQ   Take 25 mg by mouth at bedtime.      oxyCODONE-acetaminophen 5-325 MG  per tablet   Commonly known as: PERCOCET/ROXICET   Take 1-2 tablets by mouth every 6 (six) hours as needed.      polyethylene glycol packet   Commonly known as: MIRALAX / GLYCOLAX   Take 17 g by mouth daily.      PROLIA [place]   Inject 1 each into the skin every 6 (six) months.      senna-docusate 8.6-50 MG per tablet   Commonly known as: Senokot-S   Take 2 tablets by mouth at bedtime.      timolol 0.25 % ophthalmic solution   Commonly known as: BETIMOL   Place 1-2 drops into both eyes at bedtime.      traZODone 100 MG tablet   Commonly known as: DESYREL   Take 100 mg by mouth at bedtime.      Vitamin D3 50000 UNITS Caps   Take 1 capsule by mouth every 30 (thirty) days.           Follow-up Information    Follow up with Garnett Farm, MD. Call in 4 weeks. (call for time and continue foley drainage until the appt.)    Contact information:   [address] [place] [address] [phone]       Follow up with [name], MD. Call in 2 weeks.   Contact information:   [address] [facility] [address] [place] [place] [address] [phone]           The results of significant diagnostics from this hospitalization (including imaging, microbiology, ancillary and laboratory) are listed below for reference.    Significant Diagnostic Studies: Dg Chest 1 View  06/18/2012  *RADIOLOGY REPORT*  Clinical Data:  Hip/pelvic fracture, preoperative assessment  CHEST - 1 VIEW  Comparison: 01/21/2011  Findings: Left subclavian sequential transvenous pacemaker leads project at right atrium and right ventricle. Enlargement of cardiac silhouette. Calcified tortuous aorta. Pulmonary vascularity normal. Lungs clear. Question calcified granulomata projecting over right lung. No pleural effusion or pneumothorax. Compression fracture mid thoracic vertebra with evidence of prior spinal augmentation procedure.  IMPRESSION: Enlargement of cardiac silhouette post pacemaker. No acute abnormalities.  Original Report Authenticated By: Lollie Marrow, M.D.   Dg Hip Complete Right  06/18/2012  *RADIOLOGY REPORT*  Clinical Data: Fall.  Right hip pain.  RIGHT HIP - COMPLETE 2+ VIEW  Comparison: 06/05/2010.  Findings: There are new right parasymphyseal pubic fractures which appear acute, with sharply marginated edges.  Probable complimentary fracture involving the mid right inferior pubic ramus.  The sacral arcades appear intact.  The left obturator ring is intact. Iliac artery atherosclerosis.  Right total hip arthroplasty is present with unchanged displacement of the lesser trochanter from old fracture.  IMPRESSION: Acute nondisplaced right obturator ring fractures.  No contralateral obturator ring or sacral fracture identified.  Original Report Authenticated By: Andreas Newport, M.D.   Ct Head Wo Contrast  06/18/2012  *RADIOLOGY REPORT*  Clinical Data:  Found on floor.  Unwitnessed fall.  Found down. Question loss of consciousness.  Right periorbital hematoma. Headache.  CT HEAD WITHOUT CONTRAST CT CERVICAL SPINE WITHOUT CONTRAST  Technique:  Multidetector CT imaging of the head and cervical spine was performed following the standard protocol without intravenous contrast.  Multiplanar CT image reconstructions of the cervical spine were also generated.  Comparison:  CT head 06/18/2012.  CT HEAD  Findings: Multiple areas of face and scalp soft  tissue swelling are present.  There is a soft tissue hematoma lateral  to the right orbital rim measuring 21 mm x 13 mm (image 6 series 3).  Right ocular banding.  Bilateral lens extractions noted. There is no skull fracture.  Left parietal scalp soft tissue stranding compatible with contusion.  Stranding is present in the soft tissues overlying the left zygomatic arch, also compatible with contusion.  No mass lesion, mass effect, midline shift, hydrocephalus, hemorrhage.  No acute territorial cortical ischemia/infarct. Atrophy and chronic ischemic white matter disease is present. Left parietal and posterior temporal encephalomalacia are unchanged compared to prior, better seen on previous study.  IMPRESSION:  1.  No acute intracranial abnormality. 2.  Right periorbital subcutaneous hematoma. Marked swelling over the left zygomatic arch and into the left cheek extending to the left parotid is presumably post-traumatic and compatible with contusion. 3.  Small left parietal scalp contusion.  CT ORBITS WITHOUT CONTRAST  Technique:  Multidetector CT imaging of the orbits was performed following the standard protocol without intravenous contrast.  Findings:  There is no orbital fracture.  Marker overlies the right side of face.  Orbital floor and medial orbital wall appears intact.  Ocular changes as described above.  Right periorbital subcutaneous hematoma.  No retro bulb are or intraconal hematoma. Globes appear intact.  Marked soft tissue swelling is present in the left cheek extending to the left parotid, partially visualized. Presumably this is post-traumatic.  IMPRESSION:  Negative for orbital fracture.  CT CERVICAL SPINE  Findings: Reversal of the normal cervical lordosis is present. Apex of the reversal is at C5-C6.  There is no cervical spine fracture or dislocation.  Severe facet arthrosis and degenerative disc disease is present most pronounced at C4-C5 through C5-C6. Craniocervical alignment is within normal  limits.  Odontoid is intact.  Mastoid air cells appear within normal limits.  The degenerative changes with mild thickening of the transverse ligament of the atlas and calcification.  Carotid atherosclerosis. 3 mm anterolisthesis of C4 ounce C5 appears degenerative, associated with facet arthrosis.  Mild to moderate bony central stenosis at C4-C5 and C5-C6.  Multilevel foraminal stenosis, generally worse on the left.  IMPRESSION: No acute abnormality.  No cervical spine fracture or dislocation. Moderate to severe multilevel cervical spondylosis.  Original Report Authenticated By: Andreas Newport, M.D.   Ct Cervical Spine Wo Contrast  06/18/2012  *RADIOLOGY REPORT*  Clinical Data:  Found on floor.  Unwitnessed fall.  Found down. Question loss of consciousness.  Right periorbital hematoma. Headache.  CT HEAD WITHOUT CONTRAST CT CERVICAL SPINE WITHOUT CONTRAST  Technique:  Multidetector CT imaging of the head and cervical spine was performed following the standard protocol without intravenous contrast.  Multiplanar CT image reconstructions of the cervical spine were also generated.  Comparison:  CT head 06/18/2012.  CT HEAD  Findings: Multiple areas of face and scalp soft tissue swelling are present.  There is a soft tissue hematoma lateral to the right orbital rim measuring 21 mm x 13 mm (image 6 series 3).  Right ocular banding.  Bilateral lens extractions noted. There is no skull fracture.  Left parietal scalp soft tissue stranding compatible with contusion.  Stranding is present in the soft tissues overlying the left zygomatic arch, also compatible with contusion.  No mass lesion, mass effect, midline shift, hydrocephalus, hemorrhage.  No acute territorial cortical ischemia/infarct. Atrophy and chronic ischemic white matter disease is present. Left parietal and posterior temporal encephalomalacia are unchanged compared to prior, better seen on previous study.  IMPRESSION:  1.  No acute intracranial abnormality.  2.  Right periorbital subcutaneous hematoma. Marked swelling over the left zygomatic arch and into the left cheek extending to the left parotid is presumably post-traumatic and compatible with contusion. 3.  Small left parietal scalp contusion.  CT ORBITS WITHOUT CONTRAST  Technique:  Multidetector CT imaging of the orbits was performed following the standard protocol without intravenous contrast.  Findings:  There is no orbital fracture.  Marker overlies the right side of face.  Orbital floor and medial orbital wall appears intact.  Ocular changes as described above.  Right periorbital subcutaneous hematoma.  No retro bulb are or intraconal hematoma. Globes appear intact.  Marked soft tissue swelling is present in the left cheek extending to the left parotid, partially visualized. Presumably this is post-traumatic.  IMPRESSION:  Negative for orbital fracture.  CT CERVICAL SPINE  Findings: Reversal of the normal cervical lordosis is present. Apex of the reversal is at C5-C6.  There is no cervical spine fracture or dislocation.  Severe facet arthrosis and degenerative disc disease is present most pronounced at C4-C5 through C5-C6. Craniocervical alignment is within normal limits.  Odontoid is intact.  Mastoid air cells appear within normal limits.  The degenerative changes with mild thickening of the transverse ligament of the atlas and calcification.  Carotid atherosclerosis. 3 mm anterolisthesis of C4 ounce C5 appears degenerative, associated with facet arthrosis.  Mild to moderate bony central stenosis at C4-C5 and C5-C6.  Multilevel foraminal stenosis, generally worse on the left.  IMPRESSION: No acute abnormality.  No cervical spine fracture or dislocation. Moderate to severe multilevel cervical spondylosis.  Original Report Authenticated By: Andreas Newport, M.D.   Ct Maxillofacial Wo Cm  06/18/2012  *RADIOLOGY REPORT*  Clinical Data: Fall, left sided face bruising, right eye swollen shut.  CT MAXILLOFACIAL  WITHOUT CONTRAST  Technique:  Multidetector CT imaging of the maxillofacial structures was performed. Multiplanar CT image reconstructions were also generated.  Comparison: Head CT same day  Findings: There is a small hematoma lateral to the right orbit and superior to the ygomatic arch (image 65, series 3).  There is a large hematoma within the left muscles of mastication lateral the angled jaw measuring 2.5 cm in thickness compared to the normal right side which measures 1.3 cm in thickness.  There is subcutaneus stranding and edema in the soft tissues of the left face adjacent to the mandible.  There is some stranding in the parapharyngeal space on the left additionally.  No evidence of orbital fracture.  The zygoma are intact.  No evidence of maxillary fracture or pterygoid plate fracture.  No fluid in the paranasal sinuses are frontal sinus.  Nasal bones are intact.  The mandibular condyles are located.  No evidence of mandible fracture.  IMPRESSION:  1.  A large hematoma within the left muscles of mastication. 2.  Extensive subcutaneous edema and likely hemorrhage within the soft tissues of the left face adjacent to of the angle jaw extending into the parapharyngeal space. 3.  No evidence of facial fracture. 4.  Smaller left hematoma lateral to the right lateral orbit.  Original Report Authenticated By: Genevive Bi, M.D.   Ct Orbitss W/o Cm  06/18/2012  *RADIOLOGY REPORT*  Clinical Data:  Found on floor.  Unwitnessed fall.  Found down. Question loss of consciousness.  Right periorbital hematoma. Headache.  CT HEAD WITHOUT CONTRAST CT CERVICAL SPINE WITHOUT CONTRAST  Technique:  Multidetector CT imaging of the head and cervical spine was performed following the standard protocol without intravenous contrast.  Multiplanar CT image  reconstructions of the cervical spine were also generated.  Comparison:  CT head 06/18/2012.  CT HEAD  Findings: Multiple areas of face and scalp soft tissue swelling are  present.  There is a soft tissue hematoma lateral to the right orbital rim measuring 21 mm x 13 mm (image 6 series 3).  Right ocular banding.  Bilateral lens extractions noted. There is no skull fracture.  Left parietal scalp soft tissue stranding compatible with contusion.  Stranding is present in the soft tissues overlying the left zygomatic arch, also compatible with contusion.  No mass lesion, mass effect, midline shift, hydrocephalus, hemorrhage.  No acute territorial cortical ischemia/infarct. Atrophy and chronic ischemic white matter disease is present. Left parietal and posterior temporal encephalomalacia are unchanged compared to prior, better seen on previous study.  IMPRESSION:  1.  No acute intracranial abnormality. 2.  Right periorbital subcutaneous hematoma. Marked swelling over the left zygomatic arch and into the left cheek extending to the left parotid is presumably post-traumatic and compatible with contusion. 3.  Small left parietal scalp contusion.  CT ORBITS WITHOUT CONTRAST  Technique:  Multidetector CT imaging of the orbits was performed following the standard protocol without intravenous contrast.  Findings:  There is no orbital fracture.  Marker overlies the right side of face.  Orbital floor and medial orbital wall appears intact.  Ocular changes as described above.  Right periorbital subcutaneous hematoma.  No retro bulb are or intraconal hematoma. Globes appear intact.  Marked soft tissue swelling is present in the left cheek extending to the left parotid, partially visualized. Presumably this is post-traumatic.  IMPRESSION:  Negative for orbital fracture.  CT CERVICAL SPINE  Findings: Reversal of the normal cervical lordosis is present. Apex of the reversal is at C5-C6.  There is no cervical spine fracture or dislocation.  Severe facet arthrosis and degenerative disc disease is present most pronounced at C4-C5 through C5-C6. Craniocervical alignment is within normal limits.  Odontoid is  intact.  Mastoid air cells appear within normal limits.  The degenerative changes with mild thickening of the transverse ligament of the atlas and calcification.  Carotid atherosclerosis. 3 mm anterolisthesis of C4 ounce C5 appears degenerative, associated with facet arthrosis.  Mild to moderate bony central stenosis at C4-C5 and C5-C6.  Multilevel foraminal stenosis, generally worse on the left.  IMPRESSION: No acute abnormality.  No cervical spine fracture or dislocation. Moderate to severe multilevel cervical spondylosis.  Original Report Authenticated By: Andreas Newport, M.D.    Microbiology: Recent Results (from the past 240 hour(s))  URINE CULTURE     Status: Normal   Collection Time   06/19/12  6:20 PM      Component Value Range Status Comment   Specimen Description URINE, CATHETERIZED   Final    Special Requests PATIENT ON FOLLOWING ROCEPHIN   Final    Culture  Setup Time 06/20/2012 01:49   Final    Colony Count NO GROWTH   Final    Culture NO GROWTH   Final    Report Status 06/21/2012 FINAL   Final      Labs: Basic Metabolic Panel:  Lab 06/23/12 8295 06/21/12 0520 06/20/12 0505 06/19/12 0147 06/18/12 0937  NA 135 137 137 135 140  K 4.1 3.8 3.7 4.2 3.5  CL 100 101 102 99 100  CO2 27 27 29 27 31   GLUCOSE 118* 124* 116* 107* 167*  BUN 19 17 21  32* 21  CREATININE 0.65 0.79 0.76 1.22* 0.88  CALCIUM 8.3* 8.0*  8.1* 8.5 9.8  MG -- -- -- -- --  PHOS -- -- -- -- --   Liver Function Tests: No results found for this basename: AST:5,ALT:5,ALKPHOS:5,BILITOT:5,PROT:5,ALBUMIN:5 in the last 168 hours No results found for this basename: LIPASE:5,AMYLASE:5 in the last 168 hours No results found for this basename: AMMONIA:5 in the last 168 hours CBC:  Lab 06/23/12 0543 06/22/12 0500 06/21/12 0520 06/20/12 0505 06/19/12 0147 06/18/12 0937  WBC 6.8 -- 6.6 6.8 9.0 15.5*  NEUTROABS -- -- -- -- -- 13.0*  HGB 9.2* 8.1* 8.6* 8.6* 9.3* --  HCT 26.9* 23.6* 25.1* 25.2* 27.1* --  MCV 93.1 -- 91.9  92.3 92.2 92.4  PLT 164 -- 96* 91* 121* 153   Cardiac Enzymes:  Lab 06/19/12 0147 06/18/12 1815 06/18/12 0950  CKTOTAL -- -- 291*  CKMB -- -- 7.9*  CKMBINDEX -- -- --  TROPONINI <0.30 <0.30 <0.30   BNP: BNP (last 3 results) No results found for this basename: PROBNP:3 in the last 8760 hours CBG:  Lab 06/22/12 1630  GLUCAP 106*    Time coordinating discharge:  Signed:  Ameila Weldon  [facility] 06/23/2012, 9:03 AM

## 2012-07-01 ENCOUNTER — Ambulatory Visit: Payer: Medicare Other | Admitting: Internal Medicine

## 2012-08-05 ENCOUNTER — Telehealth: Payer: Self-pay | Admitting: Internal Medicine

## 2013-08-08 ENCOUNTER — Encounter: Payer: Self-pay | Admitting: *Deleted

## 2013-08-13 ENCOUNTER — Encounter: Payer: Self-pay | Admitting: Cardiovascular Disease

## 2013-08-13 ENCOUNTER — Ambulatory Visit (INDEPENDENT_AMBULATORY_CARE_PROVIDER_SITE_OTHER): Payer: PRIVATE HEALTH INSURANCE | Admitting: Cardiovascular Disease

## 2013-08-13 VITALS — BP 138/82 | HR 70 | Resp 16 | Ht 62.0 in | Wt 152.1 lb

## 2013-08-13 DIAGNOSIS — I495 Sick sinus syndrome: Secondary | ICD-10-CM

## 2013-08-13 DIAGNOSIS — Z950 Presence of cardiac pacemaker: Secondary | ICD-10-CM

## 2013-08-13 DIAGNOSIS — I10 Essential (primary) hypertension: Secondary | ICD-10-CM

## 2013-08-13 DIAGNOSIS — Z8679 Personal history of other diseases of the circulatory system: Secondary | ICD-10-CM

## 2013-08-13 DIAGNOSIS — I2510 Atherosclerotic heart disease of native coronary artery without angina pectoris: Secondary | ICD-10-CM

## 2013-08-13 LAB — PACEMAKER DEVICE OBSERVATION

## 2013-08-13 NOTE — Assessment & Plan Note (Signed)
Pacemaker check in the clinic today showed normal device function. There is roughly 72% atrial pacing and no ventricular pacing. Battery voltage is excellent suggesting another 13 years of longevity. Lead parameters are normal. There have been no meaningful episodes of atrial or ventricular tachyarrhythmia. A few episodes of atrial tachycardia are all less than 30 seconds in duration. Please see full pacemaker note for details. The patient was offered remote monitoring via the care link system but declined. She states that she prefers to come in person to the office. I have encouraged her to consider remote monitoring which may become advantageous as she gets older.

## 2013-08-13 NOTE — Progress Notes (Signed)
Patient ID: [name], female   DOB: 12/29/1921, 79 y.o.   MRN: [id number]     Reason for office visit CAD and pacemaker follow up  Since her last appointment, Mrs. Weisensel has done quite well. She denies problems with syncope, chest pain, dyspnea or lower extremity edema.  Interrogation of her pacemaker today shows normal function. She has a remote history of CAD with a previous LAD stent but is completely asymptomatic from this standpoint. She has preserved left ventricular systolic function. She had a normal nuclear stress test in 2012.  Her memory has deteriorated slightly. She wears CPAP for obstructive sleep apnea. She is very content living at [facility].  Allergies  Allergen Reactions  . Barbiturates Other (See Comments)    unknown  . Nembutal [Pentobarbital Sodium] Other (See Comments)    unknown  . Simvastatin   . Atropine Rash    Current Outpatient Prescriptions  Medication Sig Dispense Refill  . amLODipine (NORVASC) 5 MG tablet Take 5 mg by mouth daily.      Marland Kitchen aspirin 81 MG tablet Take 81 mg by mouth daily.      Marland Kitchen CALCIUM PO Take 1 tablet by mouth daily.      . carvedilol (COREG) 3.125 MG tablet Take 3.125 mg by mouth 2 (two) times daily with a meal.      . Cholecalciferol (VITAMIN D3) 50000 UNITS CAPS Take 1 capsule by mouth every 30 (thirty) days.       . Cyanocobalamin 1000 MCG/ML KIT Inject as directed every 30 (thirty) days.      . Denosumab (PROLIA [place]) Inject 1 each into the skin every 6 (six) months.      . desmopressin (DDAVP) 0.2 MG tablet Take 0.2 mg by mouth at bedtime.      . donepezil (ARICEPT) 5 MG tablet Take 5 mg by mouth at bedtime.      . feeding supplement (ENSURE) PUDG Take 1 Container by mouth 3 (three) times daily between meals.      . ferrous sulfate 220 (44 FE) MG/5ML solution Take 5 mLs (220 mg total) by mouth 2 (two) times daily with a meal.  150 mL    . losartan (COZAAR) 50 MG tablet Take 50 mg by mouth daily.      .  mirabegron ER (MYRBETRIQ) 25 MG TB24 Take 25 mg by mouth at bedtime.      . Multiple Vitamins-Minerals (ICAPS PO) Take 1 tablet by mouth daily.       . Multiple Vitamins-Minerals (MULTIVITAMIN,TX-MINERALS) tablet Take 1 tablet by mouth daily.        . nitroGLYCERIN (NITROSTAT) 0.4 MG SL tablet Place 0.4 mg under the tongue every 5 (five) minutes as needed for chest pain.      Marland Kitchen oxyCODONE-acetaminophen (PERCOCET/ROXICET) 5-325 MG per tablet Take 1 tablet by mouth every 6 (six) hours as needed for pain.      Marland Kitchen timolol (BETIMOL) 0.25 % ophthalmic solution Place 1-2 drops into both eyes at bedtime.       . traZODone (DESYREL) 100 MG tablet Take 100 mg by mouth at bedtime.      Marland Kitchen zolpidem (AMBIEN) 5 MG tablet Take 5 mg by mouth at bedtime as needed for sleep.       No current facility-administered medications for this visit.    Past Medical History  Diagnosis Date  . CAD (coronary artery disease)   . Headache(784.0)   . Hyperlipidemia   . Hypertension   .  Osteoporosis   . Sinus node dysfunction   . Hx of cardiac pacemaker, medtronic for tachy/brady syndrome 06/19/2012  . OSA on CPAP     Past Surgical History  Procedure Laterality Date  . Total hip arthroplasty    . Orif hip fracture    . Lumbar laminectomy    . Lumbar fusion    . Cardiac pacemaker placement  05/01/2011    Medtronic  . Coronary angioplasty with stent placement    . Tonsillectomy    . Nm myocar perf wall motion  02/15/2011    negative for ischemia  . Coronary angioplasty with stent placement  12/09/2007    PCI LAD - Atom stent placement  . Cardiac catheterization  03/22/2008    patent stent in the LAD, high grade stenosis in the ostium of a very small second diagonal branch with mild disease in other vessels    Family History  Problem Relation Age of Onset  . Arthritis Other   . Hypertension Other     History   Social History  . Marital Status: Widowed    Spouse Name: N/A    Number of Children: N/A  . Years  of Education: N/A   Occupational History  . Not on file.   Social History Main Topics  . Smoking status: Former Smoker    Quit date: 12/20/1959  . Smokeless tobacco: Never Used  . Alcohol Use: No  . Drug Use: No  . Sexual Activity: Not Currently    Birth Control/ Protection: Post-menopausal   Other Topics Concern  . Not on file   Social History Narrative  . No narrative on file    Review of systems: The patient specifically denies any chest pain at rest or with exertion, dyspnea at rest or with exertion, orthopnea, paroxysmal nocturnal dyspnea, syncope, palpitations, focal neurological deficits, intermittent claudication, lower extremity edema, unexplained weight gain, cough, hemoptysis or wheezing.  The patient also denies abdominal pain, nausea, vomiting, dysphagia, diarrhea, constipation, polyuria, polydipsia, dysuria, hematuria, frequency, urgency, abnormal bleeding or bruising, fever, chills, unexpected weight changes, mood swings, change in skin or hair texture, change in voice quality, auditory or visual problems, allergic reactions or rashes, new musculoskeletal complaints other than usual "aches and pains".   PHYSICAL EXAM BP 138/82  Pulse 70  Resp 16  Ht 5\' 2"  (1.575 m)  Wt 152 lb 1.6 oz (68.992 kg)  BMI 27.81 kg/m2  General: Alert, oriented x3, no distress Head: no evidence of trauma, PERRL, EOMI, no exophtalmos or lid lag, no myxedema, no xanthelasma; normal ears, nose and oropharynx Neck: normal jugular venous pulsations and no hepatojugular reflux; brisk carotid pulses without delay and no carotid bruits Chest: clear to auscultation, no signs of consolidation by percussion or palpation, normal fremitus, symmetrical and full respiratory excursions; of the left subclavian pacemaker site Cardiovascular: normal position and quality of the apical impulse, regular rhythm, normal first and second heart sounds, no murmurs, rubs or gallops Abdomen: no tenderness or  distention, no masses by palpation, no abnormal pulsatility or arterial bruits, normal bowel sounds, no hepatosplenomegaly Extremities: no clubbing, cyanosis or edema; 2+ radial, ulnar and brachial pulses bilaterally; 2+ right femoral, posterior tibial and dorsalis pedis pulses; 2+ left femoral, posterior tibial and dorsalis pedis pulses; no subclavian or femoral bruits Neurological: grossly nonfocal   EKG: Atrial paced ventricular sensed rhythm, normal  BMET    Component Value Date/Time   NA 135 06/23/2012 0543   K 4.1 06/23/2012 0543   CL 100  06/23/2012 0543   CO2 27 06/23/2012 0543   GLUCOSE 118* 06/23/2012 0543   BUN 19 06/23/2012 0543   CREATININE 0.65 06/23/2012 0543   CALCIUM 8.3* 06/23/2012 0543   GFRNONAA 78* 06/23/2012 0543   GFRAA 90* 06/23/2012 0543     ASSESSMENT AND PLAN Hx of cardiac pacemaker, medtronic for tachy/brady syndrome Pacemaker check in the clinic today showed normal device function. There is roughly 72% atrial pacing and no ventricular pacing. Battery voltage is excellent suggesting another 13 years of longevity. Lead parameters are normal. There have been no meaningful episodes of atrial or ventricular tachyarrhythmia. A few episodes of atrial tachycardia are all less than 30 seconds in duration. Please see full pacemaker note for details. The patient was offered remote monitoring via the care link system but declined. She states that she prefers to come in person to the office. I have encouraged her to consider remote monitoring which may become advantageous as she gets older.  CORONARY ARTERY DISEASE She is asymptomatic. She is taking aspirin and a Mckinney-dose beta blocker. There was concern that statins were interfering with cognitive abilities and therefore she is not on lipid lowering therapy. I am not sure that stopping the statin has changed things one way or another.  HYPERTENSION Fair control  Orders Placed This Encounter  Procedures  . EKG 12-Lead   Meds  ordered this encounter  Medications  . oxyCODONE-acetaminophen (PERCOCET/ROXICET) 5-325 MG per tablet    Sig: Take 1 tablet by mouth every 6 (six) hours as needed for pain.  Marland Kitchen desmopressin (DDAVP) 0.2 MG tablet    Sig: Take 0.2 mg by mouth at bedtime.    Junious Silk, MD, [facility] [phone] office [phone] pager

## 2013-08-13 NOTE — Assessment & Plan Note (Signed)
She is asymptomatic. She is taking aspirin and a low-dose beta blocker. There was concern that statins were interfering with cognitive abilities and therefore she is not on lipid lowering therapy. I am not sure that stopping the statin has changed things one way or another.

## 2013-08-13 NOTE — Progress Notes (Signed)
In office pacemaker interrogation. Normal device function. No changes made this session. 

## 2013-08-13 NOTE — Patient Instructions (Addendum)
Your physician recommends that you schedule a follow-up appointment in: 6 months  

## 2013-08-13 NOTE — Assessment & Plan Note (Signed)
Fair control.

## 2013-08-31 ENCOUNTER — Encounter: Payer: Self-pay | Admitting: Cardiovascular Disease

## 2013-09-28 ENCOUNTER — Telehealth: Payer: Self-pay | Admitting: Cardiovascular Disease

## 2013-09-28 MED ORDER — CELECOXIB 200 MG PO CAPS: 200.0000 mg | ORAL_CAPSULE | Freq: Every day | ORAL | Status: DC

## 2013-09-28 NOTE — Telephone Encounter (Signed)
Order for RX placed for Celebrex per [facility], per Dr. Salena Saner  Rx will be signed tomorrow and faxed to [facility] [phone].

## 2013-09-28 NOTE — Telephone Encounter (Signed)
Was taken off celebrex last year  Believes that she needs to be back on.  Has severe arthiritis  Please calll him and let him know if she can be put back on.

## 2013-09-28 NOTE — Telephone Encounter (Signed)
Forwarded to B. Lassiter, CMA.  Rx will need to be printed and faxed per son.

## 2013-09-28 NOTE — Telephone Encounter (Signed)
Message forwarded to Dr. Croitoru.  

## 2013-09-28 NOTE — Telephone Encounter (Signed)
From my point of view can resume celebrex. I think it was stopped because she had anemia and celebrex increases the likelihood of GI bleeding. Needs to be careful with restriction of sodium intake since celbrex may also cause swelling and increased BP.

## 2013-09-28 NOTE — Telephone Encounter (Signed)
Celebrex 200 mg once daily please call in

## 2013-09-28 NOTE — Telephone Encounter (Signed)
Returned call and pt verified x 2 w/ son, Loraine Leriche.  Advice given per Dr. Royann Shivers.  Son verbalized understanding and asked if Dr. Royann Shivers will fax Rx to [facility] for pt.  Stated they have a pharmacy there and will be able to fill Rx.  Son informed Dr. Royann Shivers will be notified and if Rx approved, it will be faxed.  Son verbalized understanding and stated it Dr. Royann Shivers needs "to have an intelligent conversation" about this, then he can call him.  Stated pt has severe arthritis and is in need of some comfort.  RN informed son Dr. Salena Saner will be notified for further instructions in regard to sending Rx to nursing home.  Pt verbalized understanding and agreed w/ plan.  Joetta Manners Fax: [phone]  Message forwarded to Dr. Royann Shivers.

## 2014-02-07 ENCOUNTER — Ambulatory Visit (INDEPENDENT_AMBULATORY_CARE_PROVIDER_SITE_OTHER): Payer: PRIVATE HEALTH INSURANCE | Admitting: Cardiovascular Disease

## 2014-02-07 ENCOUNTER — Encounter: Payer: Self-pay | Admitting: Cardiovascular Disease

## 2014-02-07 VITALS — BP 140/64 | HR 79 | Resp 16 | Ht 62.0 in | Wt 149.6 lb

## 2014-02-07 DIAGNOSIS — Z8679 Personal history of other diseases of the circulatory system: Secondary | ICD-10-CM | POA: Diagnosis not present

## 2014-02-07 DIAGNOSIS — I495 Sick sinus syndrome: Secondary | ICD-10-CM

## 2014-02-07 DIAGNOSIS — Z950 Presence of cardiac pacemaker: Secondary | ICD-10-CM

## 2014-02-07 DIAGNOSIS — I10 Essential (primary) hypertension: Secondary | ICD-10-CM

## 2014-02-07 DIAGNOSIS — E785 Hyperlipidemia, unspecified: Secondary | ICD-10-CM

## 2014-02-07 DIAGNOSIS — I2510 Atherosclerotic heart disease of native coronary artery without angina pectoris: Secondary | ICD-10-CM

## 2014-02-07 DIAGNOSIS — R55 Syncope and collapse: Secondary | ICD-10-CM

## 2014-02-07 LAB — MDC_IDC_ENUM_SESS_TYPE_INCLINIC
Battery Voltage: 2.78 V
Brady Statistic AP VS Percent: 58 %
Date Time Interrogation Session: 20150316144548
Lead Channel Impedance Value: 461 Ohm
Lead Channel Pacing Threshold Amplitude: 0.75 V
Lead Channel Pacing Threshold Pulse Width: 0.4 ms
Lead Channel Pacing Threshold Pulse Width: 0.46 ms
Lead Channel Setting Pacing Amplitude: 2 V
Lead Channel Setting Pacing Amplitude: 2.5 V
Lead Channel Setting Pacing Pulse Width: 0.46 ms
Lead Channel Setting Sensing Sensitivity: 4 mV
MDC IDC MSMT BATTERY IMPEDANCE: 137 Ohm
MDC IDC MSMT BATTERY REMAINING LONGEVITY: 154 mo
MDC IDC MSMT LEADCHNL RA PACING THRESHOLD AMPLITUDE: 0.5 V
MDC IDC MSMT LEADCHNL RA SENSING INTR AMPL: 1 mV
MDC IDC MSMT LEADCHNL RV IMPEDANCE VALUE: 422 Ohm
MDC IDC MSMT LEADCHNL RV SENSING INTR AMPL: 8 mV
MDC IDC STAT BRADY AP VP PERCENT: 0 %
MDC IDC STAT BRADY AS VP PERCENT: 0 %
MDC IDC STAT BRADY AS VS PERCENT: 42 %

## 2014-02-07 LAB — PACEMAKER DEVICE OBSERVATION

## 2014-02-07 NOTE — Patient Instructions (Signed)
Your physician recommends that you schedule a follow-up appointment in: 6 Months with Pacer Check

## 2014-02-09 ENCOUNTER — Telehealth: Payer: Self-pay | Admitting: Cardiovascular Disease

## 2014-02-09 NOTE — Telephone Encounter (Signed)
Pt was seen on Monday and he was unable to come with her. Please call and let him know what was the outcome of the appt.  He wants to know if  There were  any medication changes and any other concerns.He just needs an update on what is going on with her.

## 2014-02-09 NOTE — Telephone Encounter (Signed)
Forwarded to Barbara, CMA.  

## 2014-02-13 NOTE — Progress Notes (Signed)
Patient ID: [name], female   DOB: January 03, 1922, 80 y.o.   MRN: [id number]     Reason for office visit CAD, pacemaker follow up  [name] is an 80 year old woman with a history of CAD (remote LAD stent) normal left ventricular systolic function, CPAP for obstructive sleep apnea, sinus node dysfunction status post implantation of a dual-chamber pacemaker (Medtronic). She has worsening memory. She is a resident at [facility]. She is asymptomatic. She has no signs or symptoms of coronary insufficiency or heart failure. Flow interrogation of her pacemaker today shows normal device function.   Allergies  Allergen Reactions  . Barbiturates Other (See Comments)    unknown  . Nembutal [Pentobarbital Sodium] Other (See Comments)    unknown  . Simvastatin   . Atropine Rash    Current Outpatient Prescriptions  Medication Sig Dispense Refill  . amLODipine (NORVASC) 5 MG tablet Take 5 mg by mouth daily.      Marland Kitchen aspirin 81 MG tablet Take 81 mg by mouth daily.      Marland Kitchen CALCIUM PO Take 1 tablet by mouth daily.      . carvedilol (COREG) 3.125 MG tablet Take 3.125 mg by mouth 2 (two) times daily with a meal.      . celecoxib (CELEBREX) 200 MG capsule Take 1 capsule (200 mg total) by mouth daily.  30 capsule  3  . Cholecalciferol (VITAMIN D3) 50000 UNITS CAPS Take 1 capsule by mouth every 30 (thirty) days.       . Cyanocobalamin 1000 MCG/ML KIT Inject as directed every 30 (thirty) days.      . Denosumab (PROLIA [place]) Inject 1 each into the skin every 6 (six) months.      . desmopressin (DDAVP) 0.2 MG tablet Take 0.2 mg by mouth at bedtime.      . donepezil (ARICEPT) 5 MG tablet Take 5 mg by mouth at bedtime.      . lidocaine (LIDODERM) 5 % Place 1 patch onto the skin daily. Remove & Discard patch within 12 hours or as directed by MD      . losartan (COZAAR) 50 MG tablet Take 50 mg by mouth daily.      . mirabegron ER (MYRBETRIQ) 25 MG TB24 Take 25 mg by mouth at bedtime.      . Multiple  Vitamins-Minerals (ICAPS PO) Take 1 tablet by mouth daily.       . Multiple Vitamins-Minerals (MULTIVITAMIN,TX-MINERALS) tablet Take 1 tablet by mouth daily.        . nitroGLYCERIN (NITROSTAT) 0.4 MG SL tablet Place 0.4 mg under the tongue every 5 (five) minutes as needed for chest pain.      Marland Kitchen oxyCODONE-acetaminophen (PERCOCET/ROXICET) 5-325 MG per tablet Take 1 tablet by mouth every 6 (six) hours as needed for pain.      Marland Kitchen Propylene Glycol 0.6 % SOLN Apply 0.6 % to eye daily.      . timolol (BETIMOL) 0.25 % ophthalmic solution Place 1-2 drops into both eyes at bedtime.       . traZODone (DESYREL) 100 MG tablet Take 100 mg by mouth at bedtime.      Marland Kitchen zolpidem (AMBIEN) 5 MG tablet Take 5 mg by mouth at bedtime as needed for sleep.      . ferrous sulfate 220 (44 FE) MG/5ML solution Take 5 mLs (220 mg total) by mouth 2 (two) times daily with a meal.  150 mL     No current facility-administered medications for this  visit.    Past Medical History  Diagnosis Date  . CAD (coronary artery disease)   . Headache(784.0)   . Hyperlipidemia   . Hypertension   . Osteoporosis   . Sinus node dysfunction   . Hx of cardiac pacemaker, medtronic for tachy/brady syndrome 06/19/2012  . OSA on CPAP     Past Surgical History  Procedure Laterality Date  . Total hip arthroplasty    . Orif hip fracture    . Lumbar laminectomy    . Lumbar fusion    . Cardiac pacemaker placement  05/01/2011    Medtronic  . Coronary angioplasty with stent placement    . Tonsillectomy    . Nm myocar perf wall motion  02/15/2011    negative for ischemia  . Coronary angioplasty with stent placement  12/09/2007    PCI LAD - Atom stent placement  . Cardiac catheterization  03/22/2008    patent stent in the LAD, high grade stenosis in the ostium of a very small second diagonal branch with mild disease in other vessels    Family History  Problem Relation Age of Onset  . Arthritis Other   . Hypertension Other     History    Social History  . Marital Status: Widowed    Spouse Name: N/A    Number of Children: N/A  . Years of Education: N/A   Occupational History  . Not on file.   Social History Main Topics  . Smoking status: Former Smoker    Quit date: 12/20/1959  . Smokeless tobacco: Never Used  . Alcohol Use: No  . Drug Use: No  . Sexual Activity: Not Currently    Birth Control/ Protection: Post-menopausal   Other Topics Concern  . Not on file   Social History Narrative  . No narrative on file    Review of systems: The patient specifically denies any chest pain at rest or with exertion, dyspnea at rest or with exertion, orthopnea, paroxysmal nocturnal dyspnea, syncope, palpitations, focal neurological deficits, intermittent claudication, lower extremity edema, unexplained weight gain, cough, hemoptysis or wheezing.  The patient also denies abdominal pain, nausea, vomiting, dysphagia, diarrhea, constipation, polyuria, polydipsia, dysuria, hematuria, frequency, urgency, abnormal bleeding or bruising, fever, chills, unexpected weight changes, mood swings, change in skin or hair texture, change in voice quality, auditory or visual problems, allergic reactions or rashes, new musculoskeletal complaints other than usual "aches and pains".      PHYSICAL EXAM BP 140/64  Pulse 79  Resp 16  Ht 5' 2" (1.575 m)  Wt 67.858 kg (149 lb 9.6 oz)  BMI 27.36 kg/m2 General: Alert, oriented x3, no distress  Head: no evidence of trauma, PERRL, EOMI, no exophtalmos or lid lag, no myxedema, no xanthelasma; normal ears, nose and oropharynx  Neck: normal jugular venous pulsations and no hepatojugular reflux; brisk carotid pulses without delay and no carotid bruits  Chest: clear to auscultation, no signs of consolidation by percussion or palpation, normal fremitus, symmetrical and full respiratory excursions; healthy left subclavian pacemaker site  Cardiovascular: normal position and quality of the apical impulse,  regular rhythm, normal first and second heart sounds, no murmurs, rubs or gallops  Abdomen: no tenderness or distention, no masses by palpation, no abnormal pulsatility or arterial bruits, normal bowel sounds, no hepatosplenomegaly  Extremities: no clubbing, cyanosis or edema; 2+ radial, ulnar and brachial pulses bilaterally; 2+ right femoral, posterior tibial and dorsalis pedis pulses; 2+ left femoral, posterior tibial and dorsalis pedis pulses; no subclavian or  femoral bruits  Neurological: grossly nonfocal   EKG: Normal sinus rhythm, normal   Lipid Panel     Component Value Date/Time   CHOL  Value: 140        ATP III CLASSIFICATION:  <200     mg/dL   Desirable  200-239  mg/dL   Borderline High  >=240    mg/dL   High 03/20/2008 0840   TRIG 62 03/20/2008 0840   HDL 48 03/20/2008 0840   CHOLHDL 2.9 03/20/2008 0840   VLDL 12 03/20/2008 0840   LDLCALC  Value: 80        Total Cholesterol/HDL:CHD Risk Coronary Heart Disease Risk Table                     Men   Women  1/2 Average Risk   3.4   3.3 03/20/2008 0840    BMET    Component Value Date/Time   NA 135 06/23/2012 0543   K 4.1 06/23/2012 0543   CL 100 06/23/2012 0543   CO2 27 06/23/2012 0543   GLUCOSE 118* 06/23/2012 0543   BUN 19 06/23/2012 0543   CREATININE 0.65 06/23/2012 0543   CALCIUM 8.3* 06/23/2012 0543   GFRNONAA 78* 06/23/2012 0543   GFRAA 90* 06/23/2012 0543     ASSESSMENT AND PLAN  Hx of cardiac pacemaker, medtronic for tachy/brady syndrome  Pacemaker check in the clinic today showed normal device function. There is roughly 58% atrial pacing and no ventricular pacing. Battery voltage is excellent suggesting another 12 years of longevity. Lead parameters are normal. There have been no meaningful episodes of atrial or ventricular tachyarrhythmia. A few episodes of atrial tachycardia are all less than 30 seconds in duration.  Please see full pacemaker note for details. The patient was offered remote monitoring via the care link system  but declined. She states that she prefers to come in person to the office. I have encouraged her to consider remote monitoring which may become advantageous as she gets older.   CORONARY ARTERY DISEASE  She is asymptomatic. She is taking aspirin and a low-dose beta blocker. There was concern that statins were interfering with cognitive abilities and therefore she is not on lipid lowering therapy. I am not sure that stopping the statin has changed things one way or another.   HYPERTENSION  Fair control  Patient Instructions  Your physician recommends that you schedule a follow-up appointment in: 6 Months with Pacer Check     Orders Placed This Encounter  Procedures  . Implantable device check   Meds ordered this encounter  Medications  . lidocaine (LIDODERM) 5 %    Sig: Place 1 patch onto the skin daily. Remove & Discard patch within 12 hours or as directed by MD  . Propylene Glycol 0.6 % SOLN    Sig: Apply 0.6 % to eye daily.    Holli Humbles, MD, [place] [phone] office [phone] pager

## 2014-02-14 NOTE — Telephone Encounter (Signed)
Called son and reported Dr. Renaye Rakers's assessment and plan on her visit of 02/07/14.  Voiced understanding.

## 2014-04-03 ENCOUNTER — Encounter (HOSPITAL_COMMUNITY): Payer: Self-pay | Admitting: Emergency Medicine

## 2014-04-03 ENCOUNTER — Emergency Department (HOSPITAL_COMMUNITY)
Admission: EM | Admit: 2014-04-03 | Discharge: 2014-04-03 | Disposition: A | Discharge status: Home / Self Care | Payer: 59 | Attending: Emergency Medicine | Admitting: Emergency Medicine

## 2014-04-03 ENCOUNTER — Emergency Department (HOSPITAL_COMMUNITY): Payer: 59

## 2014-04-03 DIAGNOSIS — G4733 Obstructive sleep apnea (adult) (pediatric): Secondary | ICD-10-CM | POA: Diagnosis not present

## 2014-04-03 DIAGNOSIS — M810 Age-related osteoporosis without current pathological fracture: Secondary | ICD-10-CM | POA: Diagnosis not present

## 2014-04-03 DIAGNOSIS — Z79899 Other long term (current) drug therapy: Secondary | ICD-10-CM | POA: Insufficient documentation

## 2014-04-03 DIAGNOSIS — Z9981 Dependence on supplemental oxygen: Secondary | ICD-10-CM | POA: Insufficient documentation

## 2014-04-03 DIAGNOSIS — E785 Hyperlipidemia, unspecified: Secondary | ICD-10-CM | POA: Diagnosis not present

## 2014-04-03 DIAGNOSIS — R29810 Facial weakness: Secondary | ICD-10-CM | POA: Insufficient documentation

## 2014-04-03 DIAGNOSIS — I10 Essential (primary) hypertension: Secondary | ICD-10-CM | POA: Insufficient documentation

## 2014-04-03 DIAGNOSIS — Z87891 Personal history of nicotine dependence: Secondary | ICD-10-CM | POA: Insufficient documentation

## 2014-04-03 DIAGNOSIS — R350 Frequency of micturition: Secondary | ICD-10-CM | POA: Diagnosis not present

## 2014-04-03 DIAGNOSIS — Z7982 Long term (current) use of aspirin: Secondary | ICD-10-CM | POA: Diagnosis not present

## 2014-04-03 DIAGNOSIS — I4891 Unspecified atrial fibrillation: Secondary | ICD-10-CM | POA: Insufficient documentation

## 2014-04-03 DIAGNOSIS — I2510 Atherosclerotic heart disease of native coronary artery without angina pectoris: Secondary | ICD-10-CM | POA: Diagnosis not present

## 2014-04-03 DIAGNOSIS — Z950 Presence of cardiac pacemaker: Secondary | ICD-10-CM | POA: Diagnosis not present

## 2014-04-03 DIAGNOSIS — Z9889 Other specified postprocedural states: Secondary | ICD-10-CM | POA: Insufficient documentation

## 2014-04-03 HISTORY — DX: Other amnesia: R41.3

## 2014-04-03 HISTORY — DX: Peripheral vascular disease, unspecified: I73.9

## 2014-04-03 HISTORY — DX: Unspecified atrial fibrillation: I48.91

## 2014-04-03 LAB — CBC
HEMATOCRIT: 38.1 % (ref 36.0–46.0)
HEMOGLOBIN: 13.4 g/dL (ref 12.0–15.0)
MCH: 31 pg (ref 26.0–34.0)
MCHC: 35.2 g/dL (ref 30.0–36.0)
MCV: 88.2 fL (ref 78.0–100.0)
Platelets: 171 10*3/uL (ref 150–400)
RBC: 4.32 MIL/uL (ref 3.87–5.11)
RDW: 13.2 % (ref 11.5–15.5)
WBC: 7.3 10*3/uL (ref 4.0–10.5)

## 2014-04-03 LAB — BASIC METABOLIC PANEL
BUN: 16 mg/dL (ref 6–23)
CHLORIDE: 99 meq/L (ref 96–112)
CO2: 24 mEq/L (ref 19–32)
CREATININE: 0.76 mg/dL (ref 0.50–1.10)
Calcium: 9.6 mg/dL (ref 8.4–10.5)
GFR calc non Af Amer: 73 mL/min — ABNORMAL LOW (ref >90)
GFR, EST AFRICAN AMERICAN: 84 mL/min — AB (ref >90)
GLUCOSE: 115 mg/dL — AB (ref 70–99)
Potassium: 3.8 mEq/L (ref 3.7–5.3)
Sodium: 138 mEq/L (ref 137–147)

## 2014-04-03 LAB — URINALYSIS, ROUTINE W REFLEX MICROSCOPIC
BILIRUBIN URINE: NEGATIVE
GLUCOSE, UA: NEGATIVE mg/dL
HGB URINE DIPSTICK: NEGATIVE
KETONES UR: NEGATIVE mg/dL
Leukocytes, UA: NEGATIVE
NITRITE: NEGATIVE
PH: 7 (ref 5.0–8.0)
Protein, ur: NEGATIVE mg/dL
Specific Gravity, Urine: 1.01 (ref 1.005–1.030)
Urobilinogen, UA: 0.2 mg/dL (ref 0.0–1.0)

## 2014-04-03 NOTE — Discharge Instructions (Signed)
Return to the ED with any concerns including difficulty breathing, chest pain, weakness or numbness in one arm or leg, changes in vision or speech, decreased level of alertness/lethargy, or any other alarming symptoms

## 2014-04-03 NOTE — ED Notes (Signed)
Pt from [facility] via [facility].  Staff reported BP of 180/78 and 210/80 this am and facial droop and slurred speech 2 days ago.  Pt in NAD, A&O.

## 2014-04-03 NOTE — ED Notes (Signed)
Patient transported to CT 

## 2014-04-03 NOTE — ED Provider Notes (Signed)
CSN: [id number]     Arrival date & time 04/03/14  0744 History   First MD Initiated Contact with Patient 04/03/14 0747     Chief Complaint  Patient presents with  . Hypertension  . Facial Droop     (Consider location/radiation/quality/duration/timing/severity/associated sxs/prior Treatment) HPI Pt presents with c/o having 2 high blood pressure readings this morning at her nursing home.  She states that she has no complaints other than urinating more frequently overnight.  No chest pain no shortness of breath.  No focal weakness or numbness.  No headache.  No changes in vision or speech.  Per EMS there was a question of a possible facial droop earlier this week- they state labs were drawn and I was given labs from 12/14- these are her last labs drawn at the nursing home.  No current symptoms of stroke.  Pt states she has no current symptoms and feels at her baseline.  There are no other associated systemic symptoms, there are no other alleviating or modifying factors.   Past Medical History  Diagnosis Date  . CAD (coronary artery disease)   . Headache(784.0)   . Hyperlipidemia   . Hypertension   . Osteoporosis   . Sinus node dysfunction   . Hx of cardiac pacemaker, medtronic for tachy/brady syndrome 06/19/2012  . OSA on CPAP   . A-fib   . Memory loss   . PVD (peripheral vascular disease)    Past Surgical History  Procedure Laterality Date  . Total hip arthroplasty    . Orif hip fracture    . Lumbar laminectomy    . Lumbar fusion    . Cardiac pacemaker placement  05/01/2011    Medtronic  . Coronary angioplasty with stent placement    . Tonsillectomy    . Nm myocar perf wall motion  02/15/2011    negative for ischemia  . Coronary angioplasty with stent placement  12/09/2007    PCI LAD - Atom stent placement  . Cardiac catheterization  03/22/2008    patent stent in the LAD, high grade stenosis in the ostium of a very small second diagonal branch with mild disease in other vessels    Family History  Problem Relation Age of Onset  . Arthritis Other   . Hypertension Other    History  Substance Use Topics  . Smoking status: Former Smoker    Quit date: 12/20/1959  . Smokeless tobacco: Never Used  . Alcohol Use: No   OB History   Grav Para Term Preterm Abortions TAB SAB Ect Mult Living                 Review of Systems ROS reviewed and all otherwise negative except for mentioned in HPI    Allergies  Barbiturates; Nembutal; Simvastatin; and Atropine  Home Medications   Prior to Admission medications   Medication Sig Start Date End Date Taking? Authorizing Provider  amLODipine (NORVASC) 5 MG tablet Take 5 mg by mouth daily.   Yes Historical Provider, MD  aspirin 81 MG tablet Take 81 mg by mouth daily.   Yes Historical Provider, MD  calcium carbonate (OS-CAL) 600 MG TABS tablet Take 600 mg by mouth daily with breakfast.   Yes Historical Provider, MD  carvedilol (COREG) 3.125 MG tablet Take 3.125 mg by mouth 2 (two) times daily with a meal.   Yes Historical Provider, MD  cloNIDine (CATAPRES) 0.1 MG tablet Take 0.1 mg by mouth now.   Yes Historical Provider, MD  Denosumab (  PROLIA [place]) Inject 1 each into the skin every 6 (six) months.   Yes Historical Provider, MD  ferrous sulfate 220 (44 FE) MG/5ML solution Take 220 mg by mouth 3 (three) times daily with meals.   Yes Historical Provider, MD  lidocaine (LIDODERM) 5 % Place 1 patch onto the skin daily. Remove & Discard patch within 12 hours or as directed by MD   Yes Historical Provider, MD  losartan (COZAAR) 50 MG tablet Take 50 mg by mouth daily.   Yes Historical Provider, MD  Multiple Vitamins-Minerals (ICAPS PO) Take 1 tablet by mouth daily.    Yes Historical Provider, MD  Multiple Vitamins-Minerals (MULTIVITAMIN PO) Take 1 tablet by mouth daily. thenlas tablet   Yes Historical Provider, MD  oxyCODONE-acetaminophen (PERCOCET/ROXICET) 5-325 MG per tablet Take 1 tablet by mouth every 6 (six) hours as needed for  pain.   Yes Historical Provider, MD  polyethylene glycol (MIRALAX / GLYCOLAX) packet Take 17 g by mouth 2 (two) times daily.   Yes Historical Provider, MD  Propylene Glycol 0.6 % SOLN Place 1 drop into the right eye 2 (two) times daily.    Yes Historical Provider, MD  vitamin B-12 (CYANOCOBALAMIN) 1000 MCG tablet Take 1,000 mcg by mouth daily.   Yes Historical Provider, MD  Vitamin D, Cholecalciferol, 1000 UNITS TABS Take 2,000 Units by mouth daily.   Yes Historical Provider, MD  celecoxib (CELEBREX) 200 MG capsule Take 1 capsule (200 mg total) by mouth daily. 09/28/13   Mihai Croitoru, MD  Cyanocobalamin 1000 MCG/ML KIT Inject as directed every 30 (thirty) days.    Historical Provider, MD  desmopressin (DDAVP) 0.2 MG tablet Take 0.2 mg by mouth at bedtime.    Historical Provider, MD  donepezil (ARICEPT) 5 MG tablet Take 5 mg by mouth at bedtime.    Historical Provider, MD  mirabegron ER (MYRBETRIQ) 25 MG TB24 Take 25 mg by mouth at bedtime.    Historical Provider, MD  Multiple Vitamins-Minerals (MULTIVITAMIN,TX-MINERALS) tablet Take 1 tablet by mouth daily.      Historical Provider, MD  nitroGLYCERIN (NITROSTAT) 0.4 MG SL tablet Place 0.4 mg under the tongue every 5 (five) minutes as needed for chest pain.    Historical Provider, MD  timolol (BETIMOL) 0.25 % ophthalmic solution Place 1-2 drops into both eyes at bedtime.     Historical Provider, MD  traZODone (DESYREL) 100 MG tablet Take 100 mg by mouth at bedtime.    Historical Provider, MD  zolpidem (AMBIEN) 5 MG tablet Take 5 mg by mouth at bedtime as needed for sleep.    Historical Provider, MD   BP 120/51  Pulse 60  Temp(Src) 97.7 F (36.5 C) (Oral)  Resp 16  SpO2 96% Vitals reviewed Physical Exam Physical Examination: General appearance - alert, well appearing, and in no distress Mental status - alert, oriented to person, place, and time Eyes - pupils equal and reactive, extraocular eye movements intact Mouth - mucous membranes moist,  pharynx normal without lesions Chest - clear to auscultation, no wheezes, rales or rhonchi, symmetric air entry Heart - normal rate, regular rhythm, normal S1, S2, no murmurs, rubs, clicks or gallops Abdomen - soft, nontender, nondistended, no masses or organomegaly Neurological - alert, oriented x 3, cranial nerves 2-12 tested and intact, strength 5/5 in extremities, sensation intact Extremities - peripheral pulses normal, no pedal edema, no clubbing or cyanosis Skin - normal coloration and turgor, no rashes  ED Course  Procedures (including critical care time)  8:46 AM  per family at bedside they were told that patient had an episode of hypotension 2 days ago, no signs of stroke or facial droop.  Labs Review Labs Reviewed  BASIC METABOLIC PANEL - Abnormal; Notable for the following:    Glucose, Bld 115 (*)    GFR calc non Af Amer 73 (*)    GFR calc Af Amer 84 (*)    All other components within normal limits  CBC  URINALYSIS, ROUTINE W REFLEX MICROSCOPIC    Imaging Review Ct Head Wo Contrast  04/03/2014   CLINICAL DATA:  Hypertensive this morning with facial droop and slurred speech for the past 2 days  EXAM: CT HEAD WITHOUT CONTRAST  TECHNIQUE: Contiguous axial images were obtained from the base of the skull through the vertex without intravenous contrast.  COMPARISON:  CT MAXILLOFACIAL W/O CM dated 06/18/2012; CT HEAD W/O CM dated 06/18/2012  FINDINGS: Extensive bilateral vertebral and intracranial internal carotid artery calcifications. Mild diffuse atrophy, age appropriate. Mild low attenuation in the deep white matter consistent with age-appropriate involutional change. No evidence of hemorrhage, extra-axial fluid, or acute infarct. No mass or hydrocephalus.  IMPRESSION: No acute intracranial findings.   Electronically Signed   By: Skipper Cliche M.D.   On: 04/03/2014 08:47     EKG Interpretation None      MDM   Final diagnoses:  Hypertension    Pt presenting with c/o  elevated blood pressure this morning.  Normal exam and no complaints.  Labs reassuring as well as EKG.  Blood pressure has been reassuring here as well.  Discharged with strict return precautions.  Pt agreeable with plan.    Threasa Beards, MD 04/03/14 [phone]

## 2014-09-02 ENCOUNTER — Encounter: Payer: Self-pay | Admitting: *Deleted

## 2014-09-14 ENCOUNTER — Other Ambulatory Visit: Payer: Self-pay | Admitting: Endocrinology

## 2014-10-05 ENCOUNTER — Encounter: Payer: Self-pay | Admitting: *Deleted

## 2016-01-24 DEATH — deceased
